# Patient Record
Sex: Male | Born: 1960 | Race: White | Hispanic: No | Marital: Single | Smoking: Never smoker
Health system: Southern US, Community
[De-identification: ages and names within clinical notes are randomized; demographics above are authoritative.]

## PROBLEM LIST (undated history)

## (undated) DIAGNOSIS — R51 Headache: Secondary | ICD-10-CM

## (undated) DIAGNOSIS — J45909 Unspecified asthma, uncomplicated: Secondary | ICD-10-CM

## (undated) DIAGNOSIS — D75839 Thrombocytosis, unspecified: Secondary | ICD-10-CM

## (undated) DIAGNOSIS — N471 Phimosis: Secondary | ICD-10-CM

## (undated) DIAGNOSIS — K900 Celiac disease: Secondary | ICD-10-CM

## (undated) DIAGNOSIS — R748 Abnormal levels of other serum enzymes: Secondary | ICD-10-CM

## (undated) DIAGNOSIS — D473 Essential (hemorrhagic) thrombocythemia: Secondary | ICD-10-CM

## (undated) DIAGNOSIS — R945 Abnormal results of liver function studies: Secondary | ICD-10-CM

## (undated) DIAGNOSIS — K2970 Gastritis, unspecified, without bleeding: Secondary | ICD-10-CM

## (undated) DIAGNOSIS — J449 Chronic obstructive pulmonary disease, unspecified: Secondary | ICD-10-CM

## (undated) DIAGNOSIS — H409 Unspecified glaucoma: Secondary | ICD-10-CM

## (undated) DIAGNOSIS — N400 Enlarged prostate without lower urinary tract symptoms: Secondary | ICD-10-CM

## (undated) DIAGNOSIS — K5790 Diverticulosis of intestine, part unspecified, without perforation or abscess without bleeding: Secondary | ICD-10-CM

## (undated) DIAGNOSIS — D519 Vitamin B12 deficiency anemia, unspecified: Secondary | ICD-10-CM

## (undated) DIAGNOSIS — B9681 Helicobacter pylori [H. pylori] as the cause of diseases classified elsewhere: Secondary | ICD-10-CM

## (undated) DIAGNOSIS — K219 Gastro-esophageal reflux disease without esophagitis: Secondary | ICD-10-CM

## (undated) DIAGNOSIS — R519 Headache, unspecified: Secondary | ICD-10-CM

## (undated) DIAGNOSIS — I10 Essential (primary) hypertension: Secondary | ICD-10-CM

## (undated) DIAGNOSIS — C6990 Malignant neoplasm of unspecified site of unspecified eye: Secondary | ICD-10-CM

## (undated) HISTORY — DX: Thrombocytosis, unspecified: D75.839

## (undated) HISTORY — DX: Headache, unspecified: R51.9

## (undated) HISTORY — DX: Benign prostatic hyperplasia without lower urinary tract symptoms: N40.0

## (undated) HISTORY — DX: Abnormal results of liver function studies: R94.5

## (undated) HISTORY — DX: Unspecified asthma, uncomplicated: J45.909

## (undated) HISTORY — PX: EYE SURGERY: SHX253

## (undated) HISTORY — DX: Helicobacter pylori (H. pylori) as the cause of diseases classified elsewhere: B96.81

## (undated) HISTORY — DX: Phimosis: N47.1

## (undated) HISTORY — DX: Celiac disease: K90.0

## (undated) HISTORY — DX: Unspecified glaucoma: H40.9

## (undated) HISTORY — DX: Helicobacter pylori (H. pylori) as the cause of diseases classified elsewhere: K29.70

## (undated) HISTORY — DX: Gastro-esophageal reflux disease without esophagitis: K21.9

## (undated) HISTORY — DX: Headache: R51

## (undated) HISTORY — DX: Essential (primary) hypertension: I10

## (undated) HISTORY — DX: Malignant neoplasm of unspecified site of unspecified eye: C69.90

## (undated) HISTORY — DX: Chronic obstructive pulmonary disease, unspecified: J44.9

## (undated) HISTORY — DX: Diverticulosis of intestine, part unspecified, without perforation or abscess without bleeding: K57.90

## (undated) HISTORY — DX: Essential (hemorrhagic) thrombocythemia: D47.3

## (undated) HISTORY — DX: Vitamin B12 deficiency anemia, unspecified: D51.9

## (undated) HISTORY — DX: Abnormal levels of other serum enzymes: R74.8

---

## 2011-06-12 ENCOUNTER — Inpatient Hospital Stay: Payer: Self-pay | Admitting: Internal Medicine

## 2011-09-30 ENCOUNTER — Ambulatory Visit: Payer: Self-pay | Admitting: "Endocrinology

## 2011-10-18 ENCOUNTER — Ambulatory Visit: Payer: Self-pay | Admitting: "Endocrinology

## 2011-12-15 ENCOUNTER — Ambulatory Visit: Payer: Self-pay

## 2011-12-25 ENCOUNTER — Ambulatory Visit: Payer: Self-pay | Admitting: Adult Health

## 2014-07-29 ENCOUNTER — Emergency Department: Payer: Self-pay | Admitting: Emergency Medicine

## 2014-07-29 LAB — COMPREHENSIVE METABOLIC PANEL
ALBUMIN: 3.7 g/dL (ref 3.4–5.0)
ALK PHOS: 133 U/L — AB
ALT: 43 U/L
ANION GAP: 8 (ref 7–16)
BILIRUBIN TOTAL: 0.2 mg/dL (ref 0.2–1.0)
BUN: 9 mg/dL (ref 7–18)
CALCIUM: 9.1 mg/dL (ref 8.5–10.1)
CO2: 26 mmol/L (ref 21–32)
CREATININE: 0.72 mg/dL (ref 0.60–1.30)
Chloride: 108 mmol/L — ABNORMAL HIGH (ref 98–107)
EGFR (Non-African Amer.): >60
Glucose: 82 mg/dL (ref 65–99)
OSMOLALITY: 281 (ref 275–301)
Potassium: 3.5 mmol/L (ref 3.5–5.1)
SGOT(AST): 47 U/L — ABNORMAL HIGH (ref 15–37)
Sodium: 142 mmol/L (ref 136–145)
TOTAL PROTEIN: 7.5 g/dL (ref 6.4–8.2)

## 2014-07-29 LAB — URINALYSIS, COMPLETE
BILIRUBIN, UR: NEGATIVE
Bacteria: NONE SEEN
Blood: NEGATIVE
GLUCOSE, UR: NEGATIVE mg/dL (ref 0–75)
Ketone: NEGATIVE
Leukocyte Esterase: NEGATIVE
Nitrite: NEGATIVE
Ph: 5 (ref 4.5–8.0)
Protein: NEGATIVE
RBC,UR: 1 /HPF (ref 0–5)
Specific Gravity: 1.015 (ref 1.003–1.030)
Squamous Epithelial: NONE SEEN

## 2014-07-29 LAB — CBC WITH DIFFERENTIAL/PLATELET
BASOS ABS: 0.1 10*3/uL (ref 0.0–0.1)
Basophil %: 0.9 %
EOS PCT: 4.6 %
Eosinophil #: 0.3 10*3/uL (ref 0.0–0.7)
HCT: 50 % (ref 40.0–52.0)
HGB: 16.1 g/dL (ref 13.0–18.0)
Lymphocyte #: 1.2 10*3/uL (ref 1.0–3.6)
Lymphocyte %: 18.5 %
MCH: 30.7 pg (ref 26.0–34.0)
MCHC: 32.1 g/dL (ref 32.0–36.0)
MCV: 95 fL (ref 80–100)
MONOS PCT: 12.2 %
Monocyte #: 0.8 x10 3/mm (ref 0.2–1.0)
Neutrophil #: 4 10*3/uL (ref 1.4–6.5)
Neutrophil %: 63.8 %
PLATELETS: 522 10*3/uL — AB (ref 150–440)
RBC: 5.24 10*6/uL (ref 4.40–5.90)
RDW: 14.7 % — ABNORMAL HIGH (ref 11.5–14.5)
WBC: 6.3 10*3/uL (ref 3.8–10.6)

## 2014-07-29 LAB — LIPASE, BLOOD: Lipase: 79 U/L (ref 73–393)

## 2014-08-09 ENCOUNTER — Ambulatory Visit: Payer: Self-pay | Admitting: Nurse Practitioner

## 2014-08-21 ENCOUNTER — Emergency Department: Payer: Self-pay | Admitting: Emergency Medicine

## 2014-08-21 LAB — COMPREHENSIVE METABOLIC PANEL
ALT: 45 U/L
ANION GAP: 6 — AB (ref 7–16)
AST: 31 U/L (ref 15–37)
Albumin: 3.7 g/dL (ref 3.4–5.0)
Alkaline Phosphatase: 204 U/L — ABNORMAL HIGH
BILIRUBIN TOTAL: 0.2 mg/dL (ref 0.2–1.0)
BUN: 8 mg/dL (ref 7–18)
CHLORIDE: 105 mmol/L (ref 98–107)
CREATININE: 0.82 mg/dL (ref 0.60–1.30)
Calcium, Total: 8.6 mg/dL (ref 8.5–10.1)
Co2: 28 mmol/L (ref 21–32)
EGFR (Non-African Amer.): >60
Glucose: 72 mg/dL (ref 65–99)
OSMOLALITY: 274 (ref 275–301)
Potassium: 3.6 mmol/L (ref 3.5–5.1)
Sodium: 139 mmol/L (ref 136–145)
Total Protein: 7.5 g/dL (ref 6.4–8.2)

## 2014-08-21 LAB — CBC WITH DIFFERENTIAL/PLATELET
BASOS PCT: 1.3 %
Basophil #: 0.1 10*3/uL (ref 0.0–0.1)
EOS PCT: 5.8 %
Eosinophil #: 0.6 10*3/uL (ref 0.0–0.7)
HCT: 47.6 % (ref 40.0–52.0)
HGB: 15.7 g/dL (ref 13.0–18.0)
Lymphocyte #: 1.7 10*3/uL (ref 1.0–3.6)
Lymphocyte %: 16.1 %
MCH: 30.4 pg (ref 26.0–34.0)
MCHC: 32.9 g/dL (ref 32.0–36.0)
MCV: 93 fL (ref 80–100)
Monocyte #: 1.5 x10 3/mm — ABNORMAL HIGH (ref 0.2–1.0)
Monocyte %: 13.7 %
NEUTROS ABS: 6.7 10*3/uL — AB (ref 1.4–6.5)
Neutrophil %: 63.1 %
Platelet: 473 10*3/uL — ABNORMAL HIGH (ref 150–440)
RBC: 5.14 10*6/uL (ref 4.40–5.90)
RDW: 14.6 % — AB (ref 11.5–14.5)
WBC: 10.7 10*3/uL — ABNORMAL HIGH (ref 3.8–10.6)

## 2014-08-21 LAB — D-DIMER(ARMC): D-DIMER: 209 ng/mL

## 2014-08-21 LAB — PRO B NATRIURETIC PEPTIDE: B-Type Natriuretic Peptide: 114 pg/mL (ref 0–125)

## 2014-10-19 ENCOUNTER — Ambulatory Visit: Payer: Self-pay | Admitting: Family Medicine

## 2014-10-23 DIAGNOSIS — R748 Abnormal levels of other serum enzymes: Secondary | ICD-10-CM

## 2014-10-23 DIAGNOSIS — R945 Abnormal results of liver function studies: Secondary | ICD-10-CM

## 2014-10-23 HISTORY — DX: Abnormal results of liver function studies: R94.5

## 2014-10-23 HISTORY — DX: Abnormal levels of other serum enzymes: R74.8

## 2014-11-10 IMAGING — US ABDOMEN ULTRASOUND
1 series · 14 of 25 positions shown · non-contrast
Comparison: None.

CLINICAL DATA: Right-sided abdominal pain with diarrhea and weight
loss.

EXAM:
ULTRASOUND ABDOMEN COMPLETE

[Series 1: abdomen ultrasound · 0.20mm/px · 14 of 96 slices shown]
[im 1/96]
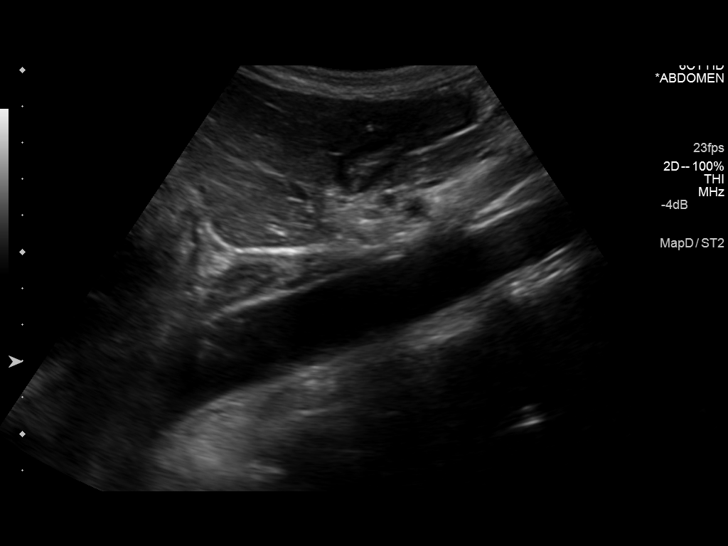
[im 8/96]
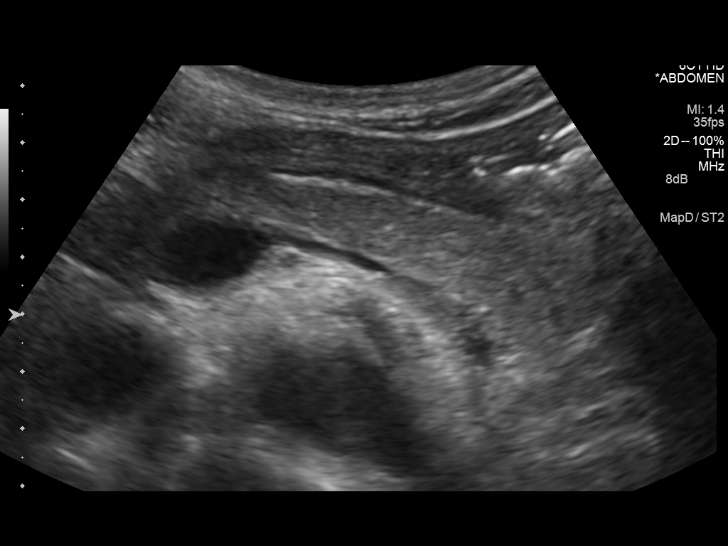
[im 16/96]
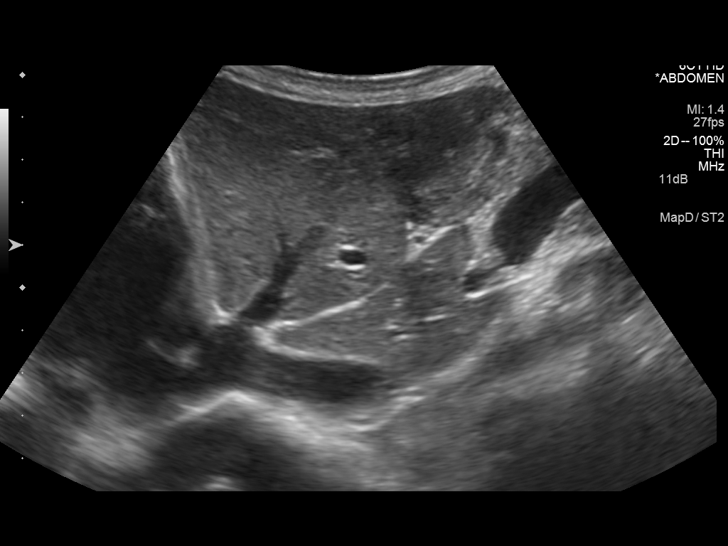
[im 24/96]
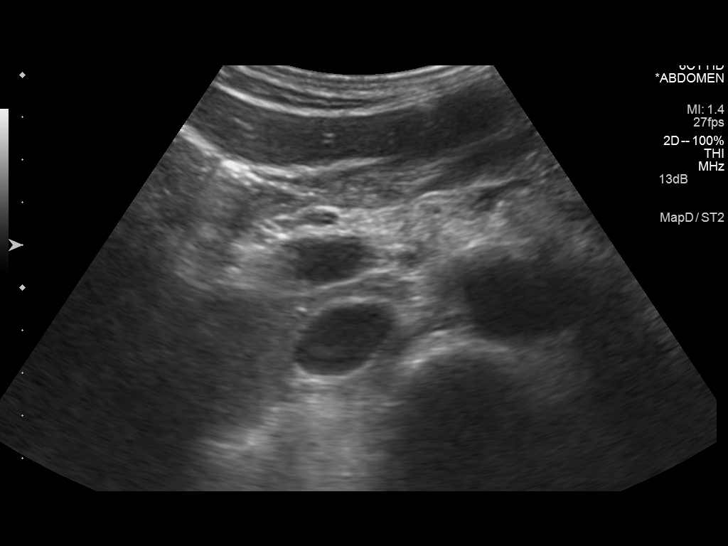
[im 32/96]
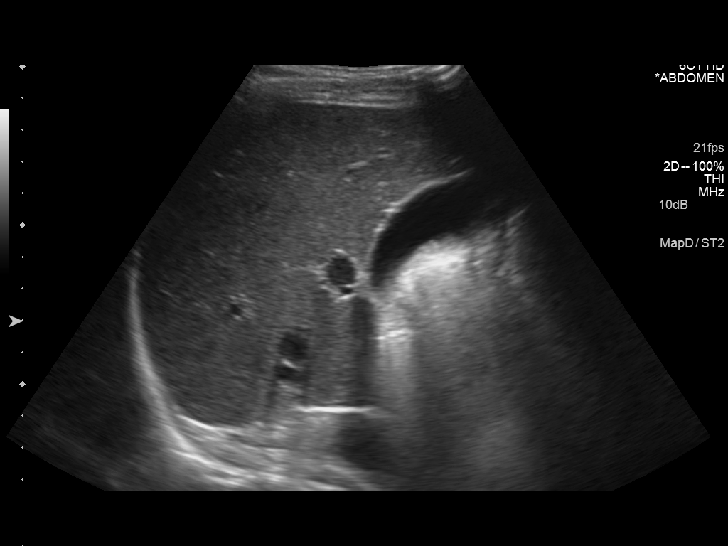
[im 36/96]
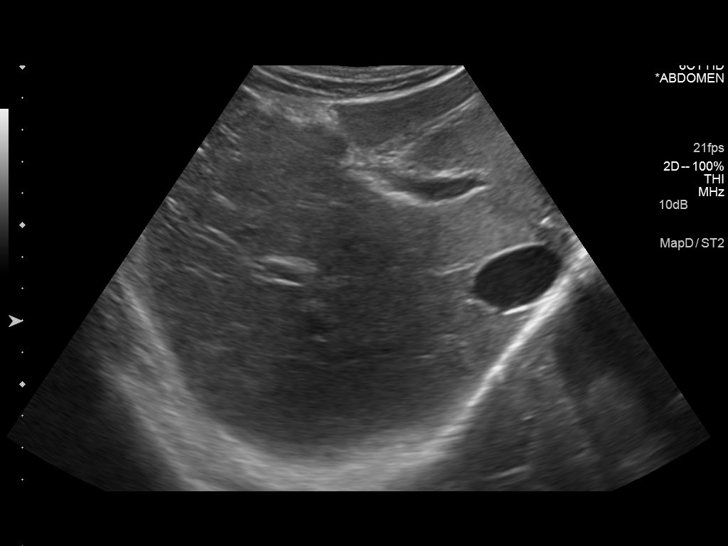
[im 44/96]
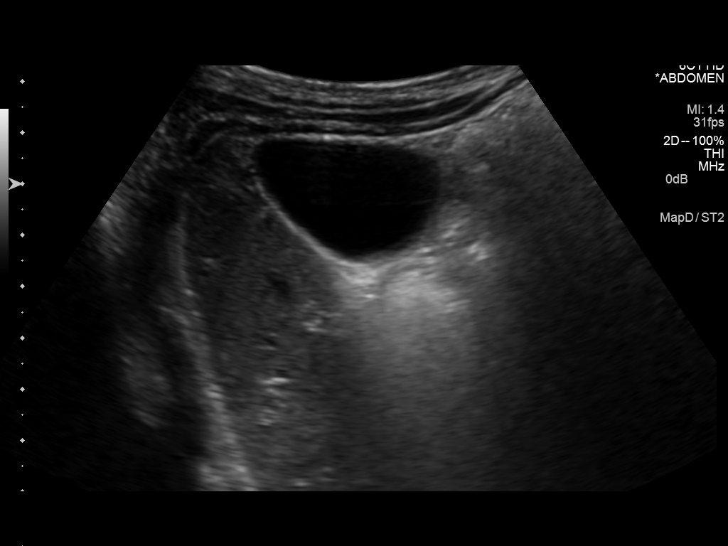
[im 52/96]
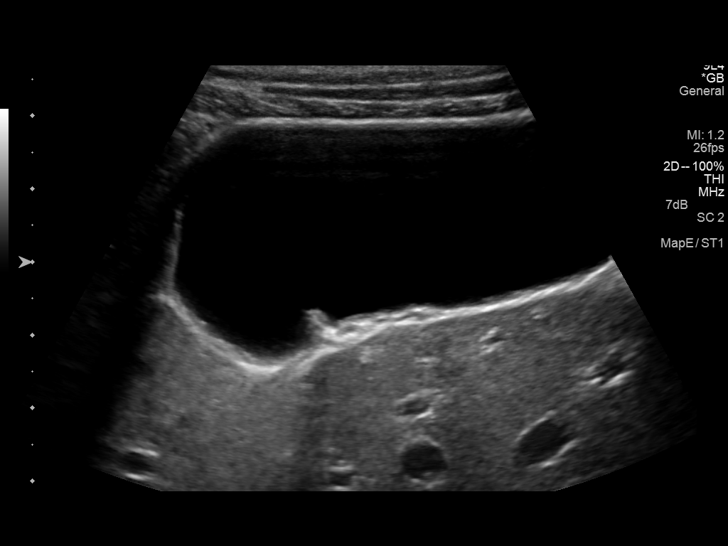
[im 60/96]
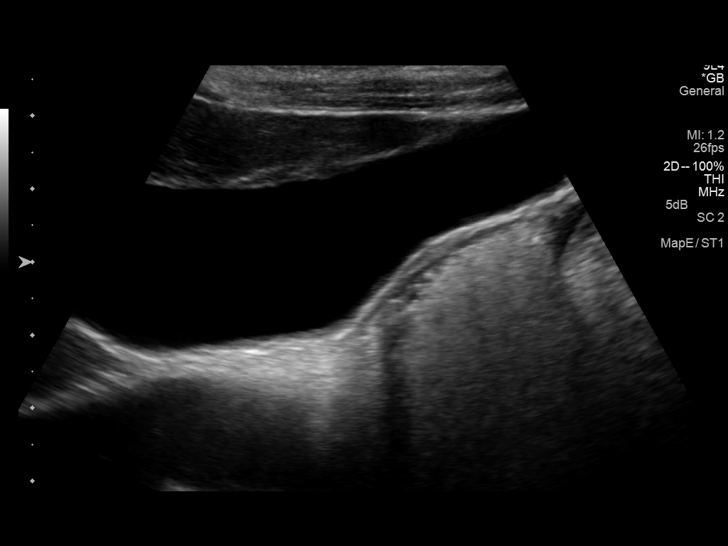
[im 64/96]
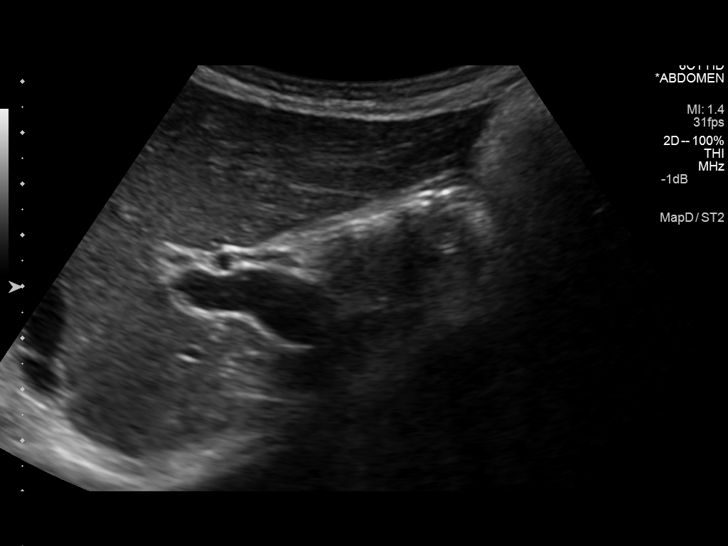
[im 72/96]
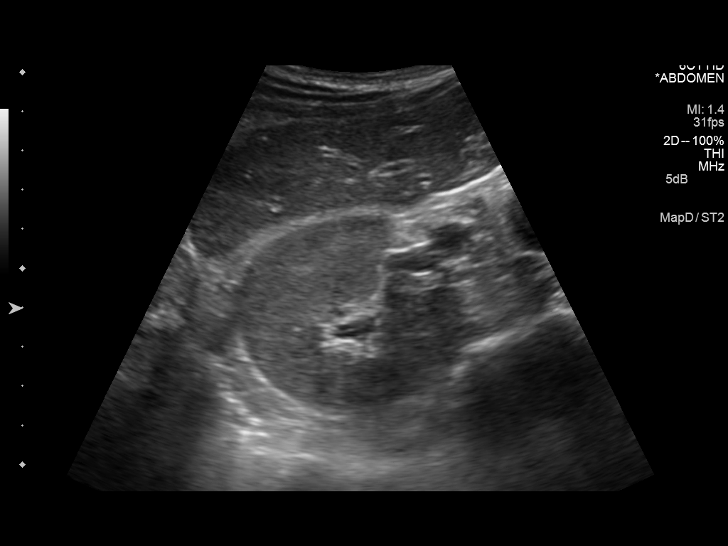
[im 80/96]
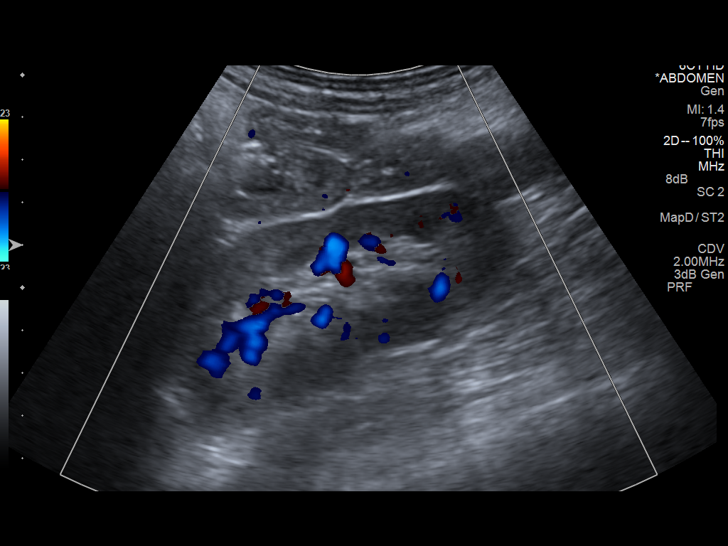
[im 88/96]
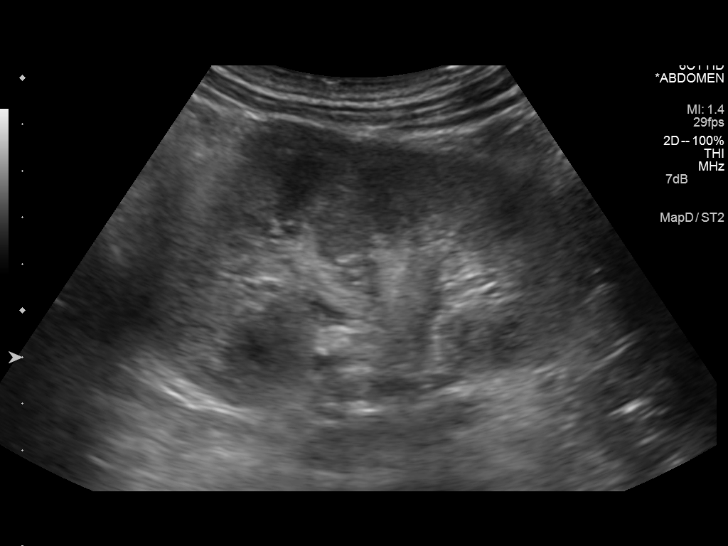
[im 96/96]
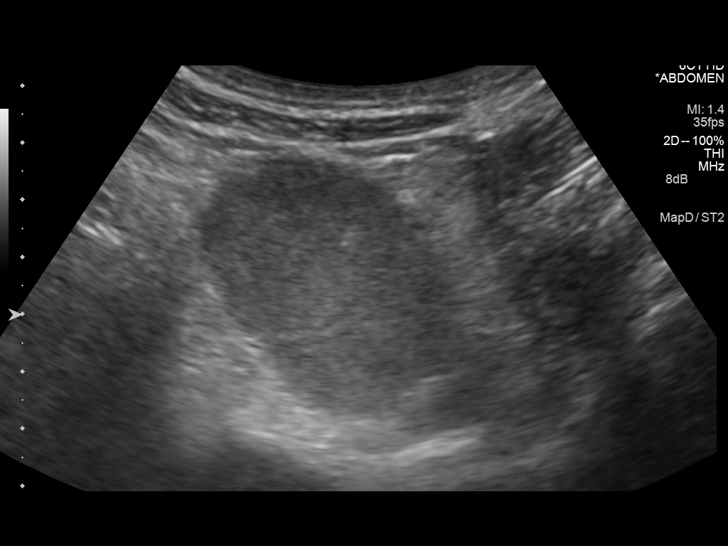

[14 of 25 positions shown; findings below may reference images not displayed]

FINDINGS: Gallbladder:

No gallstones or wall thickening visualized. No sonographic Murphy
sign noted.

Common bile duct:

Diameter: Normal measuring 1.9 mm.

Liver:

No focal lesion identified. Within normal limits in parenchymal
echogenicity.

IVC:

No abnormality visualized.

Pancreas:

Visualized portion unremarkable.

Spleen:

Size and appearance within normal limits.

Right Kidney:

Length: 11.2 cm. Echogenicity within normal limits. No mass or
hydronephrosis visualized.

Left Kidney:

Length: 10.9 cm. Echogenicity within normal limits. No mass or
hydronephrosis visualized.

Abdominal aorta:

No aneurysm visualized.

Other findings:

None.
IMPRESSION: Negative abdominal ultrasound.

## 2015-01-02 HISTORY — PX: COLONOSCOPY: SHX174

## 2015-02-26 DIAGNOSIS — N400 Enlarged prostate without lower urinary tract symptoms: Secondary | ICD-10-CM | POA: Insufficient documentation

## 2015-02-26 DIAGNOSIS — C6990 Malignant neoplasm of unspecified site of unspecified eye: Secondary | ICD-10-CM | POA: Insufficient documentation

## 2015-02-26 DIAGNOSIS — K900 Celiac disease: Secondary | ICD-10-CM | POA: Insufficient documentation

## 2015-02-26 DIAGNOSIS — K219 Gastro-esophageal reflux disease without esophagitis: Secondary | ICD-10-CM | POA: Insufficient documentation

## 2015-02-26 DIAGNOSIS — K5790 Diverticulosis of intestine, part unspecified, without perforation or abscess without bleeding: Secondary | ICD-10-CM | POA: Insufficient documentation

## 2015-02-26 DIAGNOSIS — D519 Vitamin B12 deficiency anemia, unspecified: Secondary | ICD-10-CM | POA: Insufficient documentation

## 2015-02-26 DIAGNOSIS — H409 Unspecified glaucoma: Secondary | ICD-10-CM | POA: Insufficient documentation

## 2015-02-26 DIAGNOSIS — N471 Phimosis: Secondary | ICD-10-CM | POA: Insufficient documentation

## 2015-04-24 ENCOUNTER — Other Ambulatory Visit: Payer: Self-pay

## 2015-04-24 LAB — CBC AND DIFFERENTIAL
HCT: 47 % (ref 41–53)
Hemoglobin: 16.3 g/dL (ref 13.5–17.5)
Neutrophils Absolute: 5 /uL
PLATELETS: 422 10*3/uL — AB (ref 150–399)
WBC: 7.1 10*3/mL

## 2015-04-24 LAB — TSH: TSH: 1.45 u[IU]/mL (ref 0.41–5.90)

## 2015-04-24 LAB — BASIC METABOLIC PANEL
BUN: 6 mg/dL (ref 4–21)
Creatinine: 0.8 mg/dL (ref 0.6–1.3)
Glucose: 136 mg/dL
Potassium: 3.9 mmol/L (ref 3.4–5.3)
Sodium: 142 mmol/L (ref 137–147)

## 2015-04-24 LAB — HEPATIC FUNCTION PANEL
ALT: 15 U/L (ref 10–40)
AST: 22 U/L (ref 14–40)
Alkaline Phosphatase: 105 U/L (ref 25–125)
Bilirubin, Total: 0.1 mg/dL

## 2015-04-24 LAB — HEMOGLOBIN A1C: Hemoglobin A1C: 5.9

## 2015-05-01 ENCOUNTER — Ambulatory Visit: Payer: Self-pay

## 2015-05-01 DIAGNOSIS — R7303 Prediabetes: Secondary | ICD-10-CM | POA: Insufficient documentation

## 2015-05-31 IMAGING — CR DG CHEST 2V
1 series · 2 of 2 positions shown · non-contrast
Comparison: 08/21/2014

CLINICAL DATA: persistent cough x 2-3 months, non productive, SHOB,
COPD, mid chest tightness and posterior chest pain

EXAM:
CHEST  2 VIEW

[Series 1: pa · 0.17mm/px · 2 of 2 slices shown]
[im 1/2]
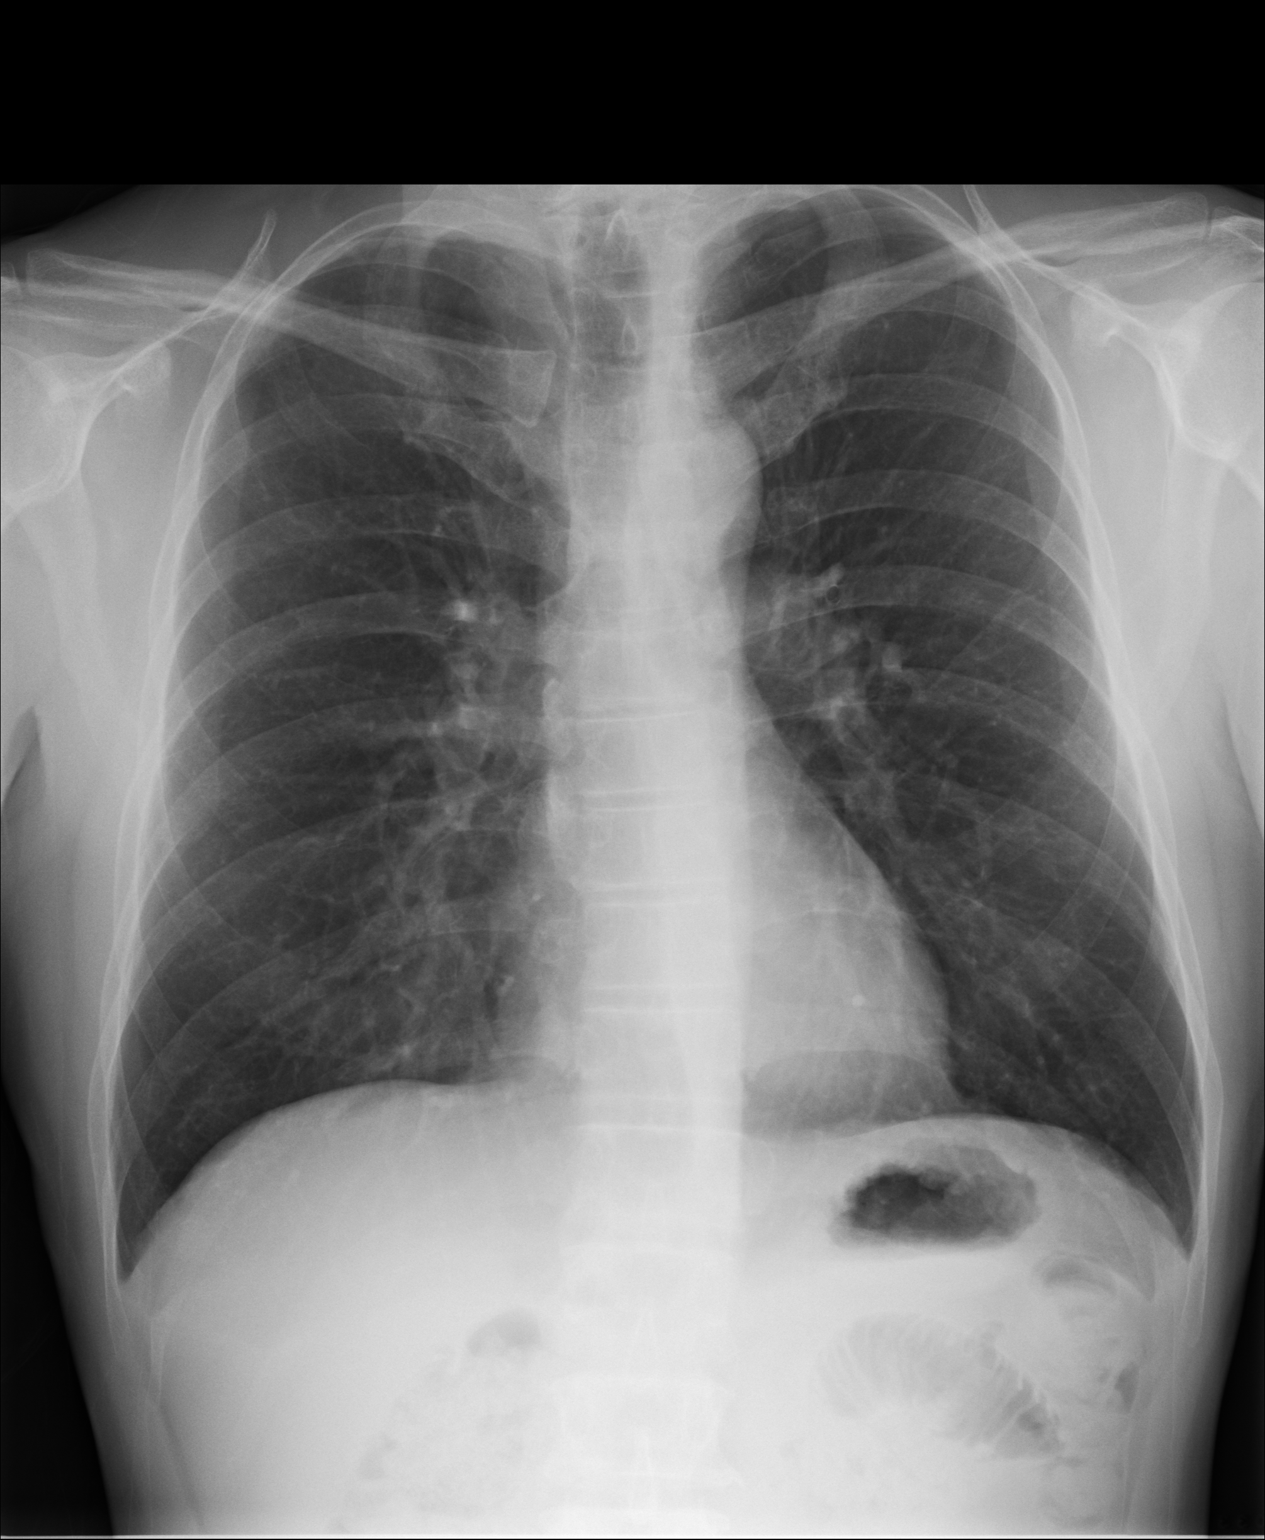
[im 2/2]
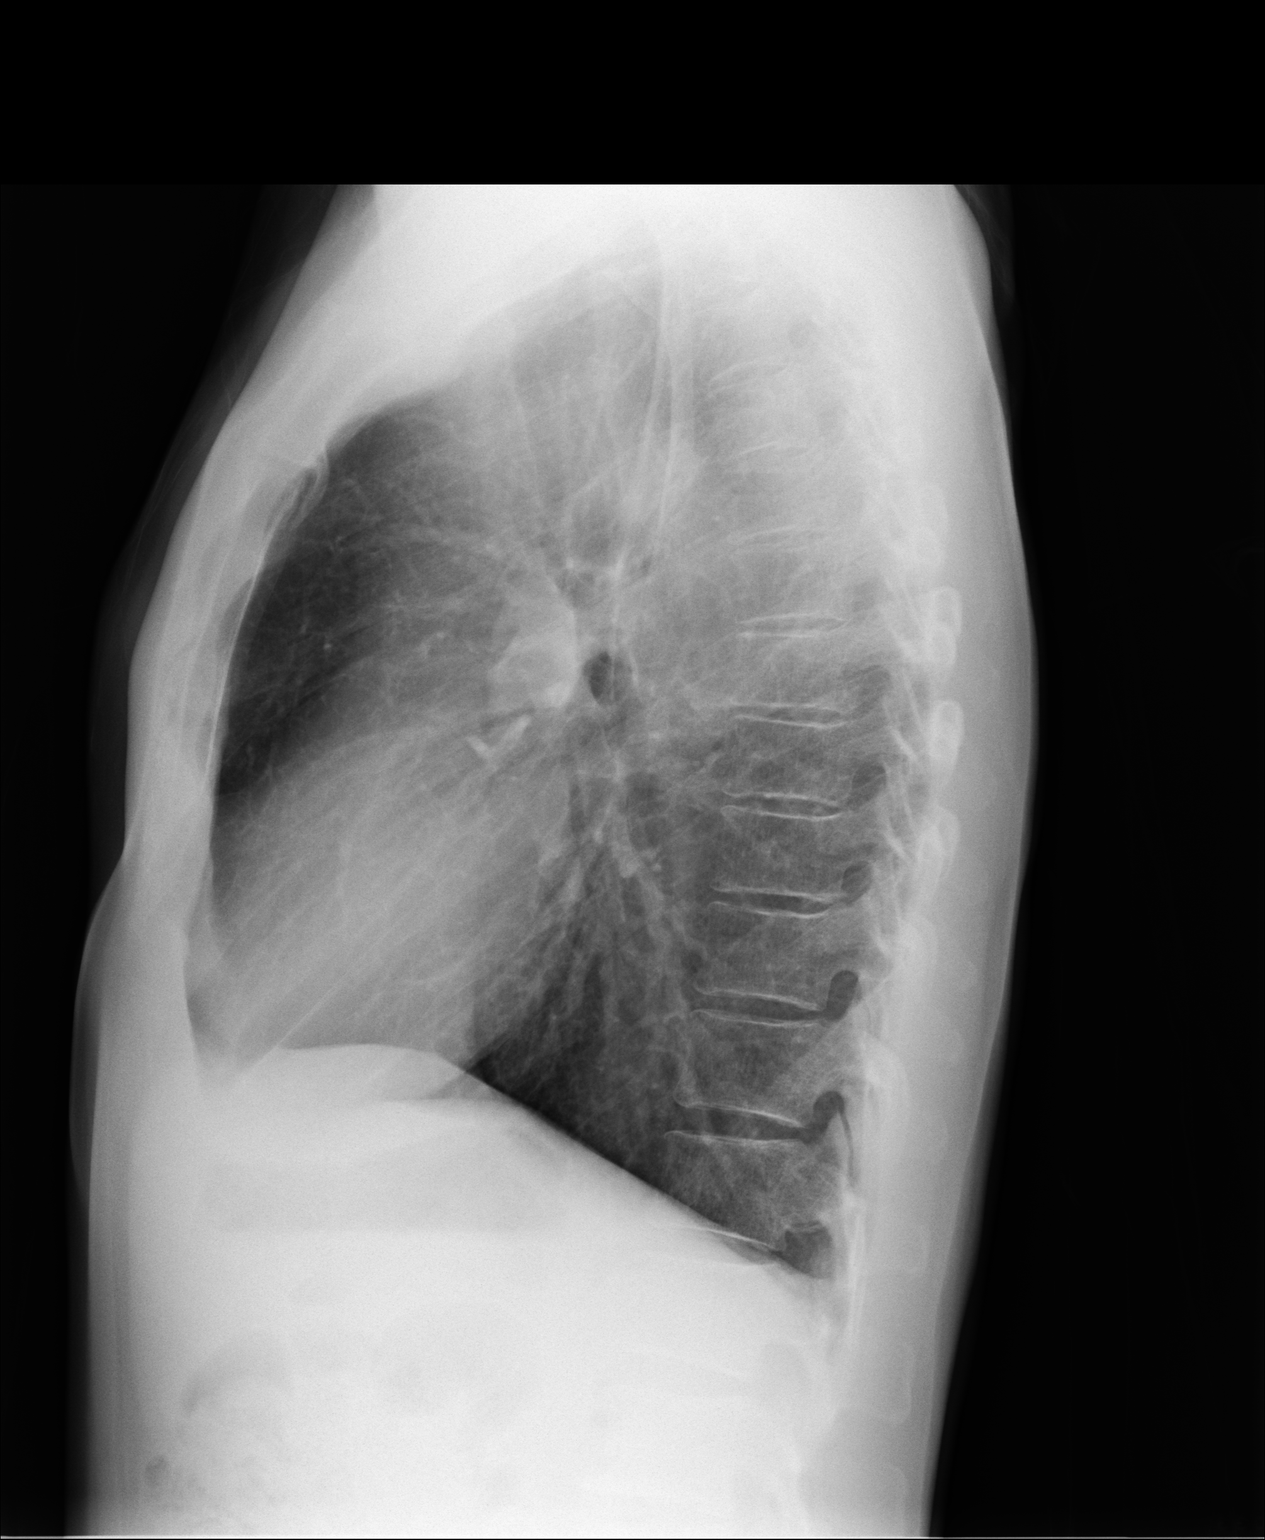

[2 of 2 positions shown; findings below may reference images not displayed]

FINDINGS: Cardiac silhouette is normal in size and configuration. Normal
mediastinal and hilar contours.

Lungs are mildly hyperexpanded but clear. No pleural effusion. No
pneumothorax.

Bony thorax is intact.
IMPRESSION: No acute cardiopulmonary disease.

## 2015-07-12 ENCOUNTER — Ambulatory Visit: Payer: Self-pay

## 2015-08-09 ENCOUNTER — Other Ambulatory Visit: Payer: Self-pay

## 2015-09-06 ENCOUNTER — Other Ambulatory Visit: Payer: Self-pay

## 2015-10-09 ENCOUNTER — Other Ambulatory Visit: Payer: Self-pay

## 2015-11-08 ENCOUNTER — Other Ambulatory Visit: Payer: Self-pay

## 2015-12-13 ENCOUNTER — Other Ambulatory Visit: Payer: Self-pay

## 2016-01-08 ENCOUNTER — Ambulatory Visit: Payer: Self-pay

## 2016-01-08 ENCOUNTER — Other Ambulatory Visit: Payer: Self-pay

## 2016-01-14 DIAGNOSIS — R7303 Prediabetes: Secondary | ICD-10-CM

## 2016-01-15 ENCOUNTER — Other Ambulatory Visit: Payer: Self-pay

## 2016-01-22 ENCOUNTER — Telehealth: Payer: Self-pay

## 2016-01-22 NOTE — Telephone Encounter (Signed)
Left message to schedule appointment @ [phone]

## 2016-01-22 NOTE — Telephone Encounter (Signed)
Patient called to reschedule missed appointment b12 injection. Patient stated he also wanted to see a doctor.  [phone]

## 2016-01-23 NOTE — Telephone Encounter (Signed)
Patient came in to schedule an appointment.

## 2016-01-24 ENCOUNTER — Ambulatory Visit: Payer: Self-pay | Admitting: Family Medicine

## 2016-02-05 ENCOUNTER — Telehealth: Payer: Self-pay

## 2016-02-05 NOTE — Telephone Encounter (Signed)
Pt lm to verify his appointment date mr asked for call back and stated if he did not answer, it is ok to leave information on voice mail. Lm @ [phone] letting mr know his appointment is this Thursday February 07, 2016 @ 7:15 for MD and B12 n injection.

## 2016-02-07 ENCOUNTER — Ambulatory Visit: Payer: Self-pay | Admitting: Urology

## 2016-02-07 VITALS — BP 169/90 | HR 72 | Wt 128.0 lb

## 2016-02-07 DIAGNOSIS — I10 Essential (primary) hypertension: Secondary | ICD-10-CM | POA: Insufficient documentation

## 2016-02-07 DIAGNOSIS — Z131 Encounter for screening for diabetes mellitus: Secondary | ICD-10-CM

## 2016-02-07 DIAGNOSIS — D518 Other vitamin B12 deficiency anemias: Secondary | ICD-10-CM | POA: Insufficient documentation

## 2016-02-07 LAB — GLUCOSE, POCT (MANUAL RESULT ENTRY): POC Glucose: 58 mg/dl — AB (ref 70–99)

## 2016-02-07 MED ORDER — CYANOCOBALAMIN 1000 MCG/ML IJ SOLN
1000.0000 ug | Freq: Once | INTRAMUSCULAR | Status: AC
  Administered 2016-09-04: 1000 ug via INTRAMUSCULAR

## 2016-02-07 MED ORDER — TAMSULOSIN HCL 0.4 MG PO CAPS: 0.4000 mg | ORAL_CAPSULE | Freq: Two times a day (BID) | ORAL | Status: DC

## 2016-02-07 MED ORDER — HYDROCHLOROTHIAZIDE 25 MG PO TABS: 25.0000 mg | ORAL_TABLET | Freq: Every day | ORAL | Status: DC

## 2016-02-07 MED ORDER — DICYCLOMINE HCL 10 MG PO CAPS: 10.0000 mg | ORAL_CAPSULE | Freq: Four times a day (QID) | ORAL | Status: DC | PRN

## 2016-02-07 MED ORDER — TAMSULOSIN HCL 0.4 MG PO CAPS: 0.4000 mg | ORAL_CAPSULE | Freq: Two times a day (BID) | ORAL | Status: AC

## 2016-02-07 MED ORDER — OMEPRAZOLE 20 MG PO CPDR: 20.0000 mg | DELAYED_RELEASE_CAPSULE | Freq: Two times a day (BID) | ORAL | Status: DC

## 2016-02-07 MED ORDER — RANITIDINE HCL 150 MG PO CAPS: 150.0000 mg | ORAL_CAPSULE | Freq: Two times a day (BID) | ORAL | Status: DC

## 2016-02-07 NOTE — Progress Notes (Signed)
       Patient: Terry Tapia Male    DOB: 24-Dec-1960   55 y.o.   MRN: [id number] Visit Date: 02/07/2016  Today's Provider: Flora   Chief Complaint  Patient presents with  . Hypertension   Subjective:    HPI Patient with phimosis.  Circumcision pending at [facility].  History of BPH with LUTS on Flomax.  HTN, untreated at this time.  He is over due for his b12 shot.  Meds need to refilled.  Wanted a Hep B vaccination.      Allergies  Allergen Reactions  . Penicillins Anaphylaxis   Previous Medications   ALBUTEROL (PROVENTIL HFA;VENTOLIN HFA) 108 (90 BASE) MCG/ACT INHALER    Inhale into the lungs every 6 (six) hours as needed for wheezing or shortness of breath.   CYANOCOBALAMIN (B-12 COMPLIANCE INJECTION IJ)    Inject 1,000 Units as directed every 28 (twenty-eight) days. 1 injection every month.   DICYCLOMINE (BENTYL) 10 MG CAPSULE    Take 10 mg by mouth 4 (four) times daily as needed for spasms (4 times daily for abdominal pain.).   LORATADINE (CLARITIN) 10 MG TABLET    Take 10 mg by mouth daily.   MONTELUKAST (SINGULAIR) 10 MG TABLET    Take 10 mg by mouth at bedtime.   OMEPRAZOLE (PRILOSEC) 20 MG CAPSULE    Take 20 mg by mouth 2 (two) times daily before a meal.   RANITIDINE (ZANTAC) 150 MG CAPSULE    Take 150 mg by mouth 2 (two) times daily.   TAMSULOSIN (FLOMAX) 0.4 MG CAPS CAPSULE    Take 0.4 mg by mouth 2 (two) times daily.    TRIAMCINOLONE (NASACORT) 55 MCG/ACT AERO NASAL INHALER    Place 2 sprays into the nose daily. Reported on 02/07/2016    Review of Systems  Social History  Substance Use Topics  . Smoking status: Never Smoker   . Smokeless tobacco: Never Used  . Alcohol Use: No   Objective:   BP 169/90 mmHg  Pulse 72  Wt 128 lb (58.06 kg)  Physical Exam  Constitutional: He appears well-developed and well-nourished.  HENT:  Head: Normocephalic.  Cardiovascular: Normal rate, regular rhythm and normal heart sounds.   Pulmonary/Chest: Effort  normal and breath sounds normal.  Abdominal: Soft. Bowel sounds are normal.        Assessment & Plan:     1. HTN:  Started on HCTZ 25 mg daily tonight.  He will need to RTC in 2 weeks for BP check.  Needs ECG.  He wants to see Dr. Ancil Boozer.  2. Vitamin b12 deficiency anemia:   Injection given today.  RTC in one month for next B12 shot. Will need CBC, TSH, CMP and HBGA1C.    3. GERD:  Meds refilled.        [facility]

## 2016-02-21 ENCOUNTER — Ambulatory Visit: Payer: Self-pay

## 2016-02-21 ENCOUNTER — Other Ambulatory Visit: Payer: Self-pay | Admitting: Urology

## 2016-02-21 VITALS — BP 145/93

## 2016-02-21 DIAGNOSIS — I10 Essential (primary) hypertension: Secondary | ICD-10-CM

## 2016-02-21 MED ORDER — TRIAMCINOLONE ACETONIDE 55 MCG/ACT NA AERO: 2.0000 | INHALATION_SPRAY | Freq: Every day | NASAL | Status: DC

## 2016-02-21 MED ORDER — MONTELUKAST SODIUM 10 MG PO TABS: 10.0000 mg | ORAL_TABLET | Freq: Every day | ORAL | Status: AC

## 2016-02-21 MED ORDER — LORATADINE 10 MG PO TABS: 10.0000 mg | ORAL_TABLET | Freq: Every day | ORAL | Status: AC

## 2016-02-21 MED ORDER — DICYCLOMINE HCL 10 MG PO CAPS: 10.0000 mg | ORAL_CAPSULE | Freq: Four times a day (QID) | ORAL | Status: DC | PRN

## 2016-02-21 MED ORDER — ALBUTEROL SULFATE HFA 108 (90 BASE) MCG/ACT IN AERS: 2.0000 | INHALATION_SPRAY | Freq: Four times a day (QID) | RESPIRATORY_TRACT | Status: AC | PRN

## 2016-02-22 NOTE — Progress Notes (Signed)
Shannon reviewed BP and EKG. Per Larene Beach, BP within normal limits and no change in medications.

## 2016-02-26 ENCOUNTER — Other Ambulatory Visit: Payer: Self-pay | Admitting: Nurse Practitioner

## 2016-02-27 ENCOUNTER — Telehealth: Payer: Self-pay

## 2016-02-27 ENCOUNTER — Other Ambulatory Visit: Payer: Self-pay

## 2016-02-27 NOTE — Telephone Encounter (Signed)
Received fax from medication management Nasacort out of stock no longer on PAP Consider veramyst or Flonase

## 2016-02-27 NOTE — Telephone Encounter (Signed)
Received fax from Medication management Nasacort AQ out of stock  Consider Veramyst or Flonase

## 2016-03-06 MED ORDER — FLUTICASONE PROPIONATE 50 MCG/ACT NA SUSP: 2.0000 | Freq: Every day | NASAL | Status: DC

## 2016-03-11 ENCOUNTER — Other Ambulatory Visit: Payer: Self-pay | Admitting: Urology

## 2016-03-13 ENCOUNTER — Other Ambulatory Visit: Payer: Self-pay

## 2016-03-18 ENCOUNTER — Ambulatory Visit: Payer: Self-pay | Admitting: Nurse Practitioner

## 2016-03-18 VITALS — BP 137/81 | HR 79 | Ht 65.0 in | Wt 127.0 lb

## 2016-03-18 DIAGNOSIS — IMO0001 Reserved for inherently not codable concepts without codable children: Secondary | ICD-10-CM

## 2016-03-18 DIAGNOSIS — R030 Elevated blood-pressure reading, without diagnosis of hypertension: Secondary | ICD-10-CM

## 2016-03-18 DIAGNOSIS — E538 Deficiency of other specified B group vitamins: Secondary | ICD-10-CM

## 2016-03-18 DIAGNOSIS — R5383 Other fatigue: Secondary | ICD-10-CM

## 2016-03-18 DIAGNOSIS — E782 Mixed hyperlipidemia: Secondary | ICD-10-CM

## 2016-03-18 DIAGNOSIS — R739 Hyperglycemia, unspecified: Secondary | ICD-10-CM

## 2016-03-18 NOTE — Progress Notes (Signed)
   Subjective:    Patient ID: Terry Tapia, male    DOB: Dec 01, 1960, 55 y.o.   MRN: [id number]  HPI   Works part-time at [employer]   Is overdue for labs, none for approx 10 months, needs B12 injection  Has had fluctuating blood pressure, was started on a med that dropped systolic to [id number], made him dizzy and he stopped taking the med,. States he had a bp reaidng of 189/90.  Reports he has not, as best he knows, has not had a repeated episode of elevated bp, believes he is to have another stress test.     Breathing is doing well GI doing better as he is on a gluten free diet.  Appetite is much better.        Review of Systems  Constitutional: Negative.   HENT: Positive for congestion.   Eyes: Negative.   Respiratory: Negative.   Cardiovascular: Negative.   Gastrointestinal: Negative.   Genitourinary: Positive for frequency. Negative for difficulty urinating.  Neurological: Negative for dizziness and syncope.  Hematological: Negative for adenopathy.       Objective:   Physical Exam  Constitutional: He is oriented to person, place, and time.  Cardiovascular: Normal rate and regular rhythm.   Pulmonary/Chest: Effort normal and breath sounds normal.  Musculoskeletal: He exhibits no edema.  Neurological: He is alert and oriented to person, place, and time.  Skin: Skin is warm and dry.          Assessment & Plan:  [name] was seen today for b12 injection.  Diagnoses and all orders for this visit:  B12 deficiency  Other fatigue -     CBC w/Diff  Mixed hyperlipidemia -     Lipid Profile  Elevated BP -     COMPLETE METABOLIC PANEL WITH GFR  Elevated blood sugar -     HgB A1c   Will give b12 injection,   Will draw appropriate labs and address as indicated  Will continue current medication   Pt is being seen for a significant part of his health care at [facility] would be helpful to see what services they are providing to avoid duplication of care and/or  resources.    Will have pt return to clinic in 4 weeks for b12 injection and bp check  Will have return to clinic in 4 months for general status check.

## 2016-03-21 ENCOUNTER — Telehealth: Payer: Self-pay

## 2016-03-21 NOTE — Telephone Encounter (Signed)
Patient states he was to call back and let Butch Penny know the BP medication he was taking and had a reaction to he said it was HCTZ.

## 2016-04-24 ENCOUNTER — Other Ambulatory Visit: Payer: Self-pay

## 2016-04-29 ENCOUNTER — Other Ambulatory Visit: Payer: Self-pay

## 2016-04-30 ENCOUNTER — Ambulatory Visit: Payer: Self-pay

## 2016-04-30 DIAGNOSIS — D519 Vitamin B12 deficiency anemia, unspecified: Secondary | ICD-10-CM

## 2016-04-30 MED ORDER — CYANOCOBALAMIN 1000 MCG/ML IJ SOLN: 1000.0000 ug | Freq: Once | INTRAMUSCULAR | Status: DC

## 2016-04-30 MED ORDER — CYANOCOBALAMIN 1000 MCG/ML IJ SOLN
1000.0000 ug | Freq: Once | INTRAMUSCULAR | Status: AC
  Administered 2016-04-30: 1000 ug via INTRAMUSCULAR

## 2016-05-08 ENCOUNTER — Ambulatory Visit: Payer: Self-pay | Admitting: Family Medicine

## 2016-05-08 VITALS — BP 114/72 | HR 69 | Ht 65.5 in | Wt 125.0 lb

## 2016-05-08 DIAGNOSIS — R7303 Prediabetes: Secondary | ICD-10-CM

## 2016-05-08 DIAGNOSIS — K219 Gastro-esophageal reflux disease without esophagitis: Secondary | ICD-10-CM

## 2016-05-08 DIAGNOSIS — M7521 Bicipital tendinitis, right shoulder: Secondary | ICD-10-CM | POA: Insufficient documentation

## 2016-05-08 LAB — GLUCOSE, POCT (MANUAL RESULT ENTRY): POC Glucose: 97 mg/dl (ref 70–99)

## 2016-05-08 MED ORDER — OMEPRAZOLE 20 MG PO CPDR: 20.0000 mg | DELAYED_RELEASE_CAPSULE | Freq: Every day | ORAL | Status: DC

## 2016-05-08 NOTE — Progress Notes (Signed)
BP 114/72   Pulse 69   Ht 5' 5.5" (1.664 m)   Wt 125 lb (56.7 kg)   SpO2 97%   BMI 20.48 kg/m    Subjective:    Patient ID: Terry Tapia, male    DOB: 1961-05-31, 55 y.o.   MRN: [id number]  HPI: Terry Tapia is a 55 y.o. male  No chief complaint on file. 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  No flowsheet data found.  Relevant past medical, surgical, family and social history reviewed Past Medical History:  Diagnosis Date  . Asthma   . BPH without urinary obstruction   . Celiac disease   . COPD (chronic obstructive pulmonary disease) (HCC)    PER HISTORY, NORMAL SPIROMETRY OFF MEDS IN OUR OFFICE  . Diverticulosis   . Elevated alkaline phosphatase level 10/23/14  . 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 GERD (gastroesophageal reflux disease)    NORMAL EGD 01/02/15  . Glaucoma   . Headache    Migraines  . Helicobacter pylori gastritis   . Liver function study, abnormal 10/23/14  . Phimosis   . Thrombocytosis ([place])   . Vitamin B12 deficiency anemia    Blood transfusion 2012   Past Surgical History:  Procedure Laterality Date  . COLONOSCOPY  01/02/15   Diverticulosis in ascending colon, no polyps  . EYE SURGERY Left    Squamous cell cancer reomved   Family History  Problem Relation Age of Onset  . Hypertension Mother   . Lung disease Mother   . GI Disease  Mother   . Diabetes Father   . Hypertension Father   . Thyroid disease Father   . Heart disease Father    Social History  Substance Use Topics  . Smoking status: Never Smoker  . Smokeless tobacco: Never Used  . Alcohol use No    Interim medical history since last visit reviewed. Allergies and medications reviewed  Review of Systems Per HPI unless specifically indicated above     Objective:    BP 114/72   Pulse 69   Ht 5' 5.5" (1.664 m)   Wt 125 lb (56.7 kg)   SpO2 97%   BMI 20.48 kg/m   Wt Readings from Last 3 Encounters:  05/08/16 125 lb (56.7 kg)  03/18/16 127 lb (57.6 kg)  02/07/16 128 lb (58.1 kg)    Physical Exam  Constitutional: He appears well-developed and well-nourished. No distress.  Cardiovascular: Normal rate.   Pulmonary/Chest: Effort normal.  Abdominal: He exhibits no distension.  Musculoskeletal:       Right elbow: He exhibits decreased range of motion. He exhibits no swelling and no effusion. Tenderness found.  Biceps tendon  Skin: No rash noted. No pallor.  Psychiatric:  He has a normal mood and affect.    Results for orders placed or performed in visit on 05/08/16  POCT Glucose (CBG)  Result Value Ref Range   POC Glucose 97 70 - 99 mg/dl      Assessment & Plan:   Problem List Items Addressed This Visit      Digestive   GERD (gastroesophageal reflux disease)    Continue ranitidine; cut back on PPI to just once a day; if tolerated, would prefer less PPI; but if stomach upset, then go back to twice a day      Relevant Medications   omeprazole (PRILOSEC) 20 MG capsule     Musculoskeletal and Integument   Biceps tendinitis on right - Primary    Other Visit Diagnoses    Diabetes mellitus, latent ([place])       CMA entered for glucose; not main reason for visit   Relevant Orders   POCT Glucose (CBG) (Completed)      Follow up plan: Return if symptoms worsen or fail to improve.  An after-visit summary was printed and given to the  patient at [place].  Please see the patient instructions which may contain other information and recommendations beyond what is mentioned above in the assessment and plan.  Meds ordered this encounter  Medications  . omeprazole (PRILOSEC) 20 MG capsule    Sig: Take 1 capsule (20 mg total) by mouth daily.    Dispense:  30 capsule    Refill:  0    Orders Placed This Encounter  Procedures  . POCT Glucose (CBG)

## 2016-05-08 NOTE — Patient Instructions (Addendum)
Do start wearing a brace on the right wrist Go easy on the right elbow Ice pack on the right elbow 15-20 minutes at a time, 3-4 times a day for 5 days; always keep a protective cloth between the ice and the skin Cut back on the omeprazole to just once a day if tolerated Avoid the trigger foods  Gastroesophageal Reflux Disease, Adult Normally, food travels down the esophagus and stays in the stomach to be digested. However, when a person has gastroesophageal reflux disease (GERD), food and stomach acid move back up into the esophagus. When this happens, the esophagus becomes sore and inflamed. Over time, GERD can create small holes (ulcers) in the lining of the esophagus.  CAUSES This condition is caused by a problem with the muscle between the esophagus and the stomach (lower esophageal sphincter, or LES). Normally, the LES muscle closes after food passes through the esophagus to the stomach. When the LES is weakened or abnormal, it does not close properly, and that allows food and stomach acid to go back up into the esophagus. The LES can be weakened by certain dietary substances, medicines, and medical conditions, including:  Tobacco use.  Pregnancy.  Having a hiatal hernia.  Heavy alcohol use.  Certain foods and beverages, such as coffee, chocolate, onions, and peppermint. RISK FACTORS This condition is more likely to develop in:  People who have an increased body weight.  People who have connective tissue disorders.  People who use NSAID medicines. SYMPTOMS Symptoms of this condition include:  Heartburn.  Difficult or painful swallowing.  The feeling of having a lump in the throat.  Abitter taste in the mouth.  Bad breath.  Having a large amount of saliva.  Having an upset or bloated stomach.  Belching.  Chest pain.  Shortness of breath or wheezing.  Ongoing (chronic) cough or a night-time cough.  Wearing away of tooth enamel.  Weight loss. Different  conditions can cause chest pain. Make sure to see your health care provider if you experience chest pain. DIAGNOSIS Your health care provider will take a medical history and perform a physical exam. To determine if you have mild or severe GERD, your health care provider may also monitor how you respond to treatment. You may also have other tests, including:  An endoscopy toexamine your stomach and esophagus with a small camera.  A test thatmeasures the acidity level in your esophagus.  A test thatmeasures how much pressure is on your esophagus.  A barium swallow or modified barium swallow to show the shape, size, and functioning of your esophagus. TREATMENT The goal of treatment is to help relieve your symptoms and to prevent complications. Treatment for this condition may vary depending on how severe your symptoms are. Your health care provider may recommend:  Changes to your diet.  Medicine.  Surgery. HOME CARE INSTRUCTIONS Diet  Follow a diet as recommended by your health care provider. This may involve avoiding foods and drinks such as:  Coffee and tea (with or without caffeine).  Drinks that containalcohol.  Energy drinks and sports drinks.  Carbonated drinks or sodas.  Chocolate and cocoa.  Peppermint and mint flavorings.  Garlic and onions.  Horseradish.  Spicy and acidic foods, including peppers, chili powder, curry powder, vinegar, hot sauces, and barbecue sauce.  Citrus fruit juices and citrus fruits, such as oranges, lemons, and limes.  Tomato-based foods, such as red sauce, chili, salsa, and pizza with red sauce.  Fried and fatty foods, such as donuts,  french fries, potato chips, and high-fat dressings.  High-fat meats, such as hot dogs and fatty cuts of red and white meats, such as rib eye steak, sausage, ham, and bacon.  High-fat dairy items, such as whole milk, butter, and cream cheese.  Eat small, frequent meals instead of large meals.  Avoid  drinking large amounts of liquid with your meals.  Avoid eating meals during the 2-3 hours before bedtime.  Avoid lying down right after you eat.  Do not exercise right after you eat. General Instructions  Pay attention to any changes in your symptoms.  Take over-the-counter and prescription medicines only as told by your health care provider. Do not take aspirin, ibuprofen, or other NSAIDs unless your health care provider told you to do so.  Do not use any tobacco products, including cigarettes, chewing tobacco, and e-cigarettes. If you need help quitting, ask your health care provider.  Wear loose-fitting clothing. Do not wear anything tight around your waist that causes pressure on your abdomen.  Raise (elevate) the head of your bed 6 inches (15cm).  Try to reduce your stress, such as with yoga or meditation. If you need help reducing stress, ask your health care provider.  If you are overweight, reduce your weight to an amount that is healthy for you. Ask your health care provider for guidance about a safe weight loss goal.  Keep all follow-up visits as told by your health care provider. This is important. SEEK MEDICAL CARE IF:  You have new symptoms.  You have unexplained weight loss.  You have difficulty swallowing, or it hurts to swallow.  You have wheezing or a persistent cough.  Your symptoms do not improve with treatment.  You have a hoarse voice. SEEK IMMEDIATE MEDICAL CARE IF:  You have pain in your arms, neck, jaw, teeth, or back.  You feel sweaty, dizzy, or light-headed.  You have chest pain or shortness of breath.  You vomit and your vomit looks like blood or coffee grounds.  You faint.  Your stool is bloody or black.  You cannot swallow, drink, or eat.   This information is not intended to replace advice given to you by your health care provider. Make sure you discuss any questions you have with your health care provider.   Document Released:  08/13/2005 Document Revised: 07/25/2015 Document Reviewed: 02/28/2015 Elsevier Interactive Patient Education 2016 [employer] Biceps Tendon Tendinitis (Distal) With Rehab Tendinitis involves inflammation and pain over the affected tendon. The distal biceps tendon (near the elbow) is vulnerable to tendinitis. Distal biceps tendonitis is usually due to the bony bump near the elbow (bicipital tuberosity) causing increased friction over the tendon. The biceps tendon attaches the biceps muscle to one bone in the elbow and two in the shoulder. It is important for proper function of the elbow and for turning the palm upward (supination). SYMPTOMS   Pain, aching, tenderness, and sometimes warmth or redness over the front of the elbow.  Pain when bending the elbow or turning the palm up, using the wrist, especially if performed against resistance.  Crackling sound (crepitation) when the tendon or elbow is moved or touched. CAUSES  The symptoms of biceps tendonitis are due to inflammation of the tendon. Inflammation may be caused by:  Strain from sudden increase in amount or intensity of activity.  Direct blow or injury to the elbow (uncommon).  Overuse or repetitive elbow bending or wrist rotation, particularly when turning the palm up, or with elbow hyperextension. RISK INCREASES  WITH:  Sports that involve contact or overhead arm activity (throwing sports, gymnastics, weightlifting, bodybuilding, rock climbing).  Heavy labor.  Poor strength and flexibility.  Failure to warm up properly before activity.  Injury to other structures of the elbow.  Restraint of the elbow. PREVENTION  Warm up and stretch properly before activity.  Allow time for recovery between activities.  Maintain physical fitness:  Strength, flexibility, and endurance.  Cardiovascular fitness.  Learn and use proper exercise technique. PROGNOSIS  With proper treatment, biceps tendon tendonitis is usually  curable within 6 weeks.  RELATED COMPLICATIONS   Longer healing time if not properly treated or if not given enough time to heal.  Chronically inflamed tendon that causes persistent pain with activity, that may progress to constant pain and potentially rupture of the tendon.  Recurring symptoms, especially if activity is resumed too soon, with overuse or with poor technique. TREATMENT  Treatment first involves ice and medicine to reduce pain and inflammation. Modify activities that cause pain, to reduce the chances of causing the condition to get worse. Strengthening and stretching exercises should be performed to promote proper use of the muscles of the elbow. These exercise may be performed at home or with a therapist. Other treatments may be given such as ultrasound or heat therapy. Surgery is usually not recommended.  MEDICATION  If pain medicine is needed, nonsteroidal anti-inflammatory medicines (aspirin and ibuprofen), or other minor pain relievers (acetaminophen), are often advised.  Do not take pain relieving medication for 7 days before surgery.  Prescription pain relievers may be given if your caregiver thinks they are needed. Use only as directed and only as much as you need. HEAT AND COLD:   Cold treatment (icing) should be applied for 10 to 15 minutes every 2 to 3 hours for inflammation and pain, and immediately after activity that aggravates your symptoms. Use ice packs or an ice massage.  Heat treatment may be used before performing stretching and strengthening activities prescribed by your caregiver, physical therapist, or athletic trainer. Use a heat pack or a warm water soak. SEEK MEDICAL CARE IF:   Symptoms get worse or do not improve in 2 weeks, despite treatment.  New, unexplained symptoms develop. (Drugs used in treatment may produce side effects.) EXERCISES  RANGE OF MOTION (ROM) AND STRETCHING EXERCISES - Biceps Tendon Tendinitis (Distal) These exercises may help  you when beginning to rehabilitate your injury. Your symptoms may go away with or without further involvement from your physician, physical therapist, or athletic trainer. While completing these exercises, remember:   Restoring tissue flexibility helps normal motion to return to the joints. This allows healthier, less painful movement and activity.  An effective stretch should be held for at least 30 seconds.  A stretch should never be painful. You should only feel a gentle lengthening or release in the stretched tissue. STRETCH - Elbow Flexors   Lie on a firm bed or countertop on your back. Be sure that you are in a comfortable position which will allow you to relax your arm muscles.  Place a folded towel under your right / left upper arm, so that your elbow and shoulder are at the same height. Extend your arm; your elbow should not rest on the bed or towel.  Allow the weight of your hand to straighten your elbow. Keep your arm and chest muscles relaxed. Your caretaker may ask you to increase the intensity of your stretch by adding a small wrist or hand weight.  Hold  for __________ seconds. You should feel a stretch on the inside of your elbow. Slowly return to the starting position. Repeat __________ times. Complete this exercise __________ times per day. RANGE OF MOTION - Supination, Active   Stand or sit with your elbows at your side. Bend your right / left elbow to 90 degrees.  Turn your palm upward until you feel a gentle stretch on the inside of your forearm.  Hold this position for __________ seconds. Slowly release and return to the starting position. Repeat __________ times. Complete this stretch __________ times per day.  RANGE OF MOTION - Pronation, Active   Stand or sit with your elbows at your side. Bend your right / left elbow to 90 degrees.  Turn your palm downward until you feel a gentle stretch on the top of your forearm.  Hold this position for __________ seconds.  Slowly release and return to the starting position. Repeat __________ times. Complete this stretch __________ times per day.  STRENGTHENING EXERCISES - Biceps Tendon Tendinitis (Distal) These exercises may help you when beginning to rehabilitate your injury. They may resolve your symptoms with or without further involvement from your physician, physical therapist or athletic trainer. While completing these exercises, remember:   Muscles can gain both the endurance and the strength needed for everyday activities through controlled exercises.  Complete these exercises as instructed by your physician, physical therapist or athletic trainer. Increase the resistance and repetitions only as guided.  You may experience muscle soreness or fatigue, but the pain or discomfort you are trying to eliminate should never get worse during these exercises. If this pain does get worse, stop and make sure you are following the directions exactly. If the pain is still present after adjustments, discontinue the exercise until you can discuss the trouble with your clinician. STRENGTH - Elbow Flexors, Isometric   Stand or sit upright on a firm surface. Place your right / left arm so that your hand is palm-up and at the height of your waist.  Place your opposite hand on top of your forearm. Gently push down as your right / left arm resists. Push as hard as you can with both arms without causing any pain or movement at your right / left elbow. Hold this stationary position for __________ seconds.  Gradually release the tension in both arms. Allow your muscles to relax completely before repeating. Repeat __________ times. Complete this exercise __________ times per day. STRENGTH - Forearm Supinators   Sit with your right / left forearm supported on a table, keeping your elbow below shoulder height. Rest your hand over the edge, palm down.  Gently grip a hammer or a soup ladle.  Without moving your elbow, slowly turn  your palm and hand upward to a "thumbs-up" position.  Hold this position for __________ seconds. Slowly return to the starting position. Repeat __________ times. Complete this exercise __________ times per day.  STRENGTH - Forearm Pronators   Sit with your right / left forearm supported on a table, keeping your elbow below shoulder height. Rest your hand over the edge, palm up.  Gently grip a hammer or a soup ladle.  Without moving your elbow, slowly turn your palm and hand upward to a "thumbs-up" position.  Hold this position for __________ seconds. Slowly return to the starting position. Repeat __________ times. Complete this exercise __________ times per day.  STRENGTH - Elbow Flexors, Supinated  With good posture, stand or sit on a firm chair without armrests. Allow your  right / left arm to rest at your side with your palm facing forward.  Holding a __________ weight, or gripping a rubber exercise band or tubing, bring your hand toward your shoulder.  Allow your muscles to control the resistance as your hand returns to your side. Repeat __________ times. Complete this exercise __________ times per day.  STRENGTH - Elbow Flexors, Neutral  With good posture, stand or sit on a firm chair without armrests. Allow your right / left arm to rest at your side with your thumb facing forward.  Holding a __________ weight, or gripping a rubber exercise band or tubing, bring your hand toward your shoulder.  Allow your muscles to control the resistance as your hand returns to your side. Repeat __________ times. Complete this exercise __________ times per day.    This information is not intended to replace advice given to you by your health care provider. Make sure you discuss any questions you have with your health care provider.   Document Released: 11/03/2005 Document Revised: 11/24/2014 Document Reviewed: 02/15/2009 Elsevier Interactive Patient Education [employer].

## 2016-05-08 NOTE — Assessment & Plan Note (Signed)
Continue ranitidine; cut back on PPI to just once a day; if tolerated, would prefer less PPI; but if stomach upset, then go back to twice a day

## 2016-05-27 ENCOUNTER — Other Ambulatory Visit: Payer: Self-pay

## 2016-05-29 ENCOUNTER — Other Ambulatory Visit: Payer: Self-pay

## 2016-06-24 ENCOUNTER — Other Ambulatory Visit: Payer: Self-pay

## 2016-06-24 NOTE — Progress Notes (Signed)
1000 mcg b12 given in L deltoid.  Tolerated without difficulty or witnessed adverse effect.

## 2016-07-24 ENCOUNTER — Other Ambulatory Visit: Payer: Self-pay

## 2016-07-24 DIAGNOSIS — D519 Vitamin B12 deficiency anemia, unspecified: Secondary | ICD-10-CM

## 2016-07-24 MED ORDER — CYANOCOBALAMIN 1000 MCG/ML IJ SOLN
1000.0000 ug | Freq: Once | INTRAMUSCULAR | Status: AC
  Administered 2016-07-24: 1000 ug via INTRAMUSCULAR

## 2016-07-24 NOTE — Progress Notes (Signed)
B12 injection given in right deltoid. PT tolerated well. appt made for next b12.

## 2016-07-28 ENCOUNTER — Other Ambulatory Visit: Payer: Self-pay | Admitting: Urology

## 2016-07-28 NOTE — Telephone Encounter (Signed)
Please advise 

## 2016-08-21 ENCOUNTER — Other Ambulatory Visit: Payer: Self-pay

## 2016-08-28 ENCOUNTER — Other Ambulatory Visit: Payer: Self-pay

## 2016-09-02 ENCOUNTER — Telehealth: Payer: Self-pay

## 2016-09-02 NOTE — Telephone Encounter (Signed)
Called pt to reschedule b12 injections. No answer. Left msg to have pt call back.

## 2016-09-04 ENCOUNTER — Other Ambulatory Visit: Payer: Self-pay

## 2016-09-04 DIAGNOSIS — D518 Other vitamin B12 deficiency anemias: Secondary | ICD-10-CM

## 2016-09-04 DIAGNOSIS — D519 Vitamin B12 deficiency anemia, unspecified: Secondary | ICD-10-CM

## 2016-09-04 NOTE — Progress Notes (Signed)
Patient is due for his B12 shot. Shot given today in L deltoid. Return 1 month for repeat B12 shot

## 2016-09-04 NOTE — Assessment & Plan Note (Signed)
B12 shot given today. Return in 1 month for repeat B12 injection

## 2016-10-07 ENCOUNTER — Other Ambulatory Visit: Payer: Self-pay

## 2016-10-14 ENCOUNTER — Other Ambulatory Visit: Payer: Self-pay | Admitting: Nurse Practitioner

## 2016-10-14 DIAGNOSIS — D519 Vitamin B12 deficiency anemia, unspecified: Secondary | ICD-10-CM

## 2016-10-14 MED ORDER — CYANOCOBALAMIN 1000 MCG/ML IJ SOLN
1000.0000 ug | Freq: Once | INTRAMUSCULAR | Status: AC
  Administered 2016-11-13: 1000 ug via INTRAMUSCULAR

## 2016-10-14 NOTE — Progress Notes (Signed)
Lot [id number], exp 08/18  Given in L deltoid                                                                                                          .Marland Kitchen...............................................Marland Kitchen

## 2016-10-15 ENCOUNTER — Other Ambulatory Visit: Payer: Self-pay | Admitting: Urology

## 2016-10-22 ENCOUNTER — Other Ambulatory Visit: Payer: Self-pay

## 2016-10-22 NOTE — Telephone Encounter (Signed)
Received refill request for dicyclomine 20 mg tablets.  Directions - take 1/2 tablet (10mg ) by mouth 4 times daily as needed for spasms/abdominal pain.  Pharmacy of choice is [facility].

## 2016-10-30 NOTE — Telephone Encounter (Signed)
Update on 10/30/16 - after checking with pharmacy [facility]), medication had already been filled.  Informed that it could be disregarded.

## 2016-10-31 ENCOUNTER — Telehealth: Payer: Self-pay | Admitting: Pharmacist

## 2016-10-31 NOTE — Telephone Encounter (Signed)
Symbicort PAPrefill called in to manufacturer today.

## 2016-11-06 ENCOUNTER — Other Ambulatory Visit: Payer: Self-pay | Admitting: Urology

## 2016-11-07 ENCOUNTER — Other Ambulatory Visit: Payer: Self-pay | Admitting: Urology

## 2016-11-13 ENCOUNTER — Other Ambulatory Visit: Payer: Self-pay

## 2016-11-13 DIAGNOSIS — Z0000 Encounter for general adult medical examination without abnormal findings: Secondary | ICD-10-CM

## 2016-11-13 NOTE — Progress Notes (Unsigned)
B12 injection given in right deltoid. PT tolerated well.

## 2016-12-10 ENCOUNTER — Other Ambulatory Visit: Payer: Self-pay | Admitting: Internal Medicine

## 2016-12-11 ENCOUNTER — Other Ambulatory Visit: Payer: Self-pay

## 2016-12-11 DIAGNOSIS — D519 Vitamin B12 deficiency anemia, unspecified: Secondary | ICD-10-CM

## 2016-12-11 NOTE — Telephone Encounter (Signed)
Received PAP application from [facility] for Symbicort placed for provider to sign. 

## 2017-01-08 ENCOUNTER — Other Ambulatory Visit: Payer: Self-pay

## 2017-01-08 ENCOUNTER — Ambulatory Visit: Payer: Self-pay

## 2020-04-10 ENCOUNTER — Ambulatory Visit: Payer: Self-pay | Attending: Internal Medicine

## 2020-04-10 DIAGNOSIS — Z23 Encounter for immunization: Secondary | ICD-10-CM

## 2020-04-10 NOTE — Progress Notes (Signed)
   [facility]ovid[facility] Vaccination [facility]  Name:  Terry Tapia    MRN: [id number] DOB: 1960/12/18  04/10/2020  [name] was observed post Covid-19 immunization for 30 minutes based on pre-vaccination screening without incident. He was provided with Vaccine Information Sheet and instruction to access the V-Safe system.   [name] was instructed to call 911 with any severe reactions post vaccine: Marland Kitchen Difficulty breathing  . Swelling of face and throat  . A fast heartbeat  . A bad rash all over body  . Dizziness and weakness   Immunizations Administered    Name Date Dose VIS Date Route   Pfizer COVID-19 Vaccine 04/10/2020  6:12 PM 0.3 mL 01/11/2019 Intramuscular   Manufacturer: [place]   Lot: [id number]   [place]: [id number]

## 2020-05-01 ENCOUNTER — Ambulatory Visit: Payer: Self-pay | Attending: Internal Medicine

## 2020-05-01 DIAGNOSIS — Z23 Encounter for immunization: Secondary | ICD-10-CM

## 2020-05-01 NOTE — Progress Notes (Signed)
   Covid-19 Vaccination Clinic  Name:  Terry Tapia    MRN: [id number] DOB: [birth year]  05/01/2020  [name] was observed post Covid-19 immunization for 15 minutes without incident. He was provided with Vaccine Information Sheet and instruction to access the V-Safe system.   [name] was instructed to call 911 with any severe reactions post vaccine: Marland Kitchen Difficulty breathing  . Swelling of face and throat  . A fast heartbeat  . A bad rash all over body  . Dizziness and weakness   Immunizations Administered    Name Date Dose VIS Date Route   Pfizer COVID-19 Vaccine 05/01/2020  3:55 PM 0.3 mL 01/11/2019 Intramuscular   Manufacturer: [place]   Lot: [id number]   [place]: [id number]

## 2021-12-11 ENCOUNTER — Encounter: Payer: Self-pay | Admitting: Emergency Medicine

## 2021-12-11 ENCOUNTER — Ambulatory Visit
Admission: EM | Admit: 2021-12-11 | Discharge: 2021-12-11 | Disposition: A | Discharge status: Home / Self Care | Payer: Self-pay | Attending: Urgent Care | Admitting: Urgent Care

## 2021-12-11 ENCOUNTER — Other Ambulatory Visit: Payer: Self-pay

## 2021-12-11 DIAGNOSIS — M79601 Pain in right arm: Secondary | ICD-10-CM

## 2021-12-11 DIAGNOSIS — W540XXA Bitten by dog, initial encounter: Secondary | ICD-10-CM

## 2021-12-11 DIAGNOSIS — Z23 Encounter for immunization: Secondary | ICD-10-CM

## 2021-12-11 MED ORDER — TETANUS-DIPHTH-ACELL PERTUSSIS 5-2.5-18.5 LF-MCG/0.5 IM SUSY
0.5000 mL | PREFILLED_SYRINGE | Freq: Once | INTRAMUSCULAR | Status: AC
  Administered 2021-12-11: 20:00:00 0.5 mL via INTRAMUSCULAR

## 2021-12-11 MED ORDER — ACETAMINOPHEN 325 MG PO TABS: 650.0000 mg | ORAL_TABLET | Freq: Four times a day (QID) | ORAL | 0 refills | Status: DC | PRN

## 2021-12-11 NOTE — ED Triage Notes (Signed)
Pt here with possible dog bite to right shoulder, but pt states he is unsure if it is actually a bite. Pt states the dog is not known to him, but the owner verbally stated the dog is up to date on his shots. Pt has yet to have actual documentation and advised that he should seek that as soon as possible.

## 2021-12-11 NOTE — ED Provider Notes (Signed)
Renae Gloss   MRN: [id number] DOB: 23-Apr-1961  Subjective:   Terry Tapia is a 61 y.o. male presenting for dog bite to the right upper arm suffered today about 3 hours ago.  Patient believes that the dog is vaccinated.  However, they are still in the process of confirming this for sure.  Cannot recall his last Tdap.  No bleeding.  Patient did have the wound cleaned at home, use soap and water and alcohol.  No current facility-administered medications for this encounter.  Current Outpatient Medications:    acetaminophen (TYLENOL) 500 MG tablet, Take 500 mg by mouth., Disp: , Rfl:    albuterol (PROVENTIL HFA;VENTOLIN HFA) 108 (90 Base) MCG/ACT inhaler, Inhale 2 puffs into the lungs every 6 (six) hours as needed for wheezing or shortness of breath., Disp: 1 Inhaler, Rfl: 12   Cyanocobalamin (B-12 COMPLIANCE INJECTION IJ), Inject 1,000 Units as directed every 28 (twenty-eight) days. 1 injection every month., Disp: , Rfl:    dicyclomine (BENTYL) 10 MG capsule, Take 1 capsule by mouth 4 times daily as needed for spasms/abdominal pain., Disp: 120 capsule, Rfl: 0   dicyclomine (BENTYL) 20 MG tablet, Take 1/2 TABLET (10MG ) by mouth 4 times daily as needed for spasms/abdominal pain., Disp: 60 tablet, Rfl: 0   fluticasone (FLONASE) 50 MCG/ACT nasal spray, Place 2 sprays into both nostrils daily., Disp: 16 g, Rfl: 0   hydrochlorothiazide (HYDRODIURIL) 25 MG tablet, Take 1 tablet (25 mg total) by mouth daily., Disp: 30 tablet, Rfl: 3   loratadine (CLARITIN) 10 MG tablet, Take 1 tablet (10 mg total) by mouth daily., Disp: 90 tablet, Rfl: 3   montelukast (SINGULAIR) 10 MG tablet, Take 1 tablet (10 mg total) by mouth at bedtime., Disp: 90 tablet, Rfl: 3   omeprazole (PRILOSEC) 20 MG capsule, Take 1 capsule (20 mg total) by mouth daily., Disp: 30 capsule, Rfl: 0   ranitidine (ZANTAC) 150 MG capsule, Take 1 capsule (150 mg total) by mouth 2 (two) times daily., Disp: 60 capsule, Rfl: 3    SYMBICORT 160-4.5 MCG/ACT inhaler, INHALE 2 PUFFS 2 TIMES A DAY., Disp: 10.2 g, Rfl: 0   tamsulosin (FLOMAX) 0.4 MG CAPS capsule, Take 1 capsule (0.4 mg total) by mouth 2 (two) times daily., Disp: 60 capsule, Rfl: 12   triamcinolone (NASACORT) 55 MCG/ACT AERO nasal inhaler, Place 2 sprays into the nose daily. 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   GERD (gastroesophageal reflux disease)    NORMAL EGD 01/02/15   Glaucoma    Headache    Migraines   Helicobacter pylori gastritis    Liver function study, abnormal 10/23/14   Phimosis    Thrombocytosis    Vitamin B12 deficiency anemia    Blood transfusion 2012     Past Surgical History:  Procedure Laterality Date   COLONOSCOPY  01/02/15   Diverticulosis in ascending colon, no polyps   EYE SURGERY Left    Squamous cell cancer reomved    Family History  Problem Relation Age of Onset  Hypertension Mother    Lung disease Mother    GI Disease Mother    Diabetes Father    Hypertension Father    Thyroid disease Father    Heart disease Father     Social History   Tobacco Use   Smoking status: Never   Smokeless tobacco: Never  Substance Use Topics   Alcohol use: No   Drug use: No    ROS   Objective:   Vitals: BP (!) 167/91    Pulse (!) 124    Temp 98 F (36.7 C)    Resp 20    SpO2 97%   Physical Exam Constitutional:      General: He is not in acute distress.    Appearance: Normal appearance. He is well-developed and normal weight. He is not ill-appearing, toxic-appearing or diaphoretic.  HENT:     Head: Normocephalic  and atraumatic.     Right Ear: External ear normal.     Left Ear: External ear normal.     Nose: Nose normal.     Mouth/Throat:     Pharynx: Oropharynx is clear.  Eyes:     General: No scleral icterus.       Right eye: No discharge.        Left eye: No discharge.     Extraocular Movements: Extraocular movements intact.  Cardiovascular:     Rate and Rhythm: Normal rate.  Pulmonary:     Effort: Pulmonary effort is normal.  Musculoskeletal:       Arms:     Cervical back: Normal range of motion.  Neurological:     Mental Status: He is alert and oriented to person, place, and time.  Psychiatric:        Mood and Affect: Mood normal.        Behavior: Behavior normal.        Thought Content: Thought content normal.        Judgment: Judgment normal.       Assessment and Plan :   PDMP not reviewed this encounter.  1. Right arm pain   2. Need for diphtheria-tetanus-pertussis (Tdap) vaccine   3. Dog bite, initial encounter     Will hold off on antibiotics for now. Tdap updated in clinic. Use Tylenol for pain relief. Wound care reviewed. Counseled patient on potential for adverse effects with medications prescribed/recommended today, ER and return-to-clinic precautions discussed, patient verbalized understanding.    Jaynee Eagles, PA-C 12/11/21 2008

## 2021-12-11 NOTE — Discharge Instructions (Addendum)
Please make sure you keep your wound covered with a nonstick dressing and Coban.  Do this through the weekend.  Use Tylenol for your aches and pains.  If you develop redness, drainage of pus, fevers then come back to our clinic as these are signs of infection and need to be addressed with an antibiotic.

## 2023-02-25 ENCOUNTER — Encounter: Payer: Self-pay | Admitting: Internal Medicine

## 2023-02-26 ENCOUNTER — Encounter: Payer: Self-pay | Admitting: Internal Medicine

## 2023-02-27 ENCOUNTER — Ambulatory Visit: Payer: Medicare HMO | Attending: Internal Medicine | Admitting: Internal Medicine

## 2023-02-27 ENCOUNTER — Encounter: Payer: Self-pay | Admitting: Internal Medicine

## 2023-02-27 ENCOUNTER — Other Ambulatory Visit
Admission: RE | Admit: 2023-02-27 | Discharge: 2023-02-27 | Disposition: A | Discharge status: Home / Self Care | Payer: Medicare HMO | Source: Ambulatory Visit | Attending: Internal Medicine | Admitting: Internal Medicine

## 2023-02-27 VITALS — BP 128/70 | HR 84 | Ht 65.0 in | Wt 131.2 lb

## 2023-02-27 DIAGNOSIS — R072 Precordial pain: Secondary | ICD-10-CM

## 2023-02-27 DIAGNOSIS — R0602 Shortness of breath: Secondary | ICD-10-CM

## 2023-02-27 DIAGNOSIS — I10 Essential (primary) hypertension: Secondary | ICD-10-CM

## 2023-02-27 DIAGNOSIS — Z720 Tobacco use: Secondary | ICD-10-CM

## 2023-02-27 LAB — BASIC METABOLIC PANEL
Anion gap: 11 (ref 5–15)
BUN: 9 mg/dL (ref 8–23)
CO2: 24 mmol/L (ref 22–32)
Calcium: 9.2 mg/dL (ref 8.9–10.3)
Chloride: 103 mmol/L (ref 98–111)
Creatinine, Ser: 0.71 mg/dL (ref 0.61–1.24)
GFR, Estimated: >60 mL/min (ref >60)
Glucose, Bld: 87 mg/dL (ref 70–99)
Potassium: 3.8 mmol/L (ref 3.5–5.1)
Sodium: 138 mmol/L (ref 135–145)

## 2023-02-27 MED ORDER — METOPROLOL TARTRATE 100 MG PO TABS: ORAL_TABLET | ORAL | 0 refills | Status: DC

## 2023-02-27 NOTE — Patient Instructions (Addendum)
Medication Instructions:  Your Physician recommend you continue on your current medication as directed.    *If you need a refill on your cardiac medications before your next appointment, please call your pharmacy*   Lab Work: Your provider would like for you to have following labs drawn: (BMP).   Please go to the [facility] entrance and check in at the front desk.  You do not need an appointment.  They are open from 7am-6 pm.     Testing/Procedures: Your physician has requested that you have an echocardiogram. Echocardiography is a painless test that uses sound waves to create images of your heart. It provides your doctor with information about the size and shape of your heart and how well your heart's chambers and valves are working.   You may receive an ultrasound enhancing agent through an IV if needed to better visualize your heart during the echo. This procedure takes approximately one hour.  There are no restrictions for this procedure.  This will take place at [address] ([facility]) [phone], [place] [address]   Cardiac CT Angiography (CTA), is a special type of CT scan that uses a computer to produce multi-dimensional views of major blood vessels throughout the body. In CT angiography, a contrast material is injected through an IV to help visualize the blood vessels  Please see instructions below  Follow-Up: At [facility], you and your health needs are our priority.  As part of our continuing mission to provide you with exceptional heart care, we have created designated Provider Care Teams.  These Care Teams include your primary Cardiologist (physician) and Advanced Practice Providers (APPs -  Physician Assistants and Nurse Practitioners) who all work together to provide you with the care you need, when you need it.  We recommend signing up for the patient portal called "MyChart".  Sign up information is provided on this After Visit Summary.   MyChart is used to connect with patients for Virtual Visits (Telemedicine).  Patients are able to view lab/test results, encounter notes, upcoming appointments, etc.  Non-urgent messages can be sent to your provider as well.   To learn more about what you can do with MyChart, go to [email or web address].    Your next appointment:   1 month(s)  Provider:   You may see Yvonne Kendall, MD or one of the following Advanced Practice Providers on your designated Care Team:   Nicolasa Ducking, NP Eula Listen, PA-C Cadence Fransico Michael, PA-C Charlsie Quest, NP      Your cardiac CT will be scheduled at one of the below locations:   [facility] [address] [place], [place] [address] [phone]  OR   [facility] [address] [place], [place] [address] [phone]  If scheduled at [facility], please arrive at the [facility] and Children's Entrance (Entrance C2) of [facility] 30 minutes prior to test start time. You can use the FREE valet parking offered at entrance C (encouraged to control the heart rate for the test)  Proceed to the [facility] Radiology Department (first floor) to check-in and test prep.  All radiology patients and guests should use entrance C2 at [facility], accessed from [facility], even though the hospital's physical address listed is [address]    If scheduled at [facility] or [facility], please arrive 15 mins early for check-in and test prep.  Please follow these instructions carefully (unless otherwise directed):  Hold all erectile dysfunction medications at least 3 days (72 hrs) prior to test. (Ie viagra, cialis, sildenafil, tadalafil, etc) We will administer nitroglycerin during this exam.   On the Night Before the Test: Be sure to Drink plenty of water. Do not consume any  caffeinated/decaffeinated beverages or chocolate 12 hours prior to your test. Do not take any antihistamines 12 hours prior to your test.   On the Day of the Test: Drink plenty of water until 1 hour prior to the test. Do not eat any food 1 hour prior to test. You may take your regular medications prior to the test.  Take metoprolol (Lopressor) 100 mg  two hours prior to test. If you take Furosemide/Hydrochlorothiazide/Spironolactone, please HOLD on the morning of the test. FEMALES- please wear underwire-free bra if available, avoid dresses & tight clothing       After the Test: Drink plenty of water. After receiving IV contrast, you may experience a mild flushed feeling. This is normal. On occasion, you may experience a mild rash up to 24 hours after the test. This is not dangerous. If this occurs, you can take Benadryl 25 mg and increase your fluid intake. If you experience trouble breathing, this can be serious. If it is severe call 911 IMMEDIATELY. If it is mild, please call our office. If you take any of these medications: Glipizide/Metformin, Avandament, Glucavance, please do not take 48 hours after completing test unless otherwise instructed.  We will call to schedule your test 2-4 weeks out understanding that some insurance companies will need an authorization prior to the service being performed.   For non-scheduling related questions, please contact the cardiac imaging nurse navigator should you have any questions/concerns: Rockwell Alexandria, Cardiac Imaging Nurse Navigator Larey Brick, Cardiac Imaging Nurse Navigator [facility] Direct Office Dial: [phone]   For scheduling needs, including cancellations and rescheduling, please call [place], [phone].

## 2023-02-27 NOTE — Progress Notes (Unsigned)
New Outpatient Visit Date: 02/27/2023  Referring Provider: Dudley Major, FNP [address] [place],  [place] [address]  Chief Complaint: Chest pain  HPI:  Terry Tapia is a 62 y.o. male who is being seen today for the evaluation of chest pain and abnormal EKG at the request of Ms. Wood. He has a history of hypertension, asthma/COPD, celiac disease,, and GERD.  He reports a 1 to 68-month history of intermittent chest pain.  It seems to be located mostly near the left shoulder but cannot radiate towards the left breast.  It is sometimes sharp but more often a burning sensation that can last 2 to 3 days at a time.  Terry Tapia also has chronic low back pain that often radiates to his upper back between his shoulder blades.  This also has a burning quality, and it is unclear if some of what he feels near the left shoulder and breast is actually radiation from the back.  He notes that his back pain began after a work accident in 2021 or 2022.  He also has severe pain in his legs that limits his mobility.  He notes chronic shortness of breath.  He has fallen several times on account of his right leg "giving out."  He sometimes feels "funny headed," which he attributes to certain medications (unable to provide specific names) though he has not passed out.  He underwent exercise stress testing at [facility] in 2016 for evaluation of atypical chest pain.  The study was nondiagnostic due to failure to achieve target heart rate.  Echocardiogram at that time showed mildly reduced LVEF of 50% but no other significant structural abnormalities.  --------------------------------------------------------------------------------------------------  Cardiovascular History & Procedures: Cardiovascular Problems: Atypical chest pain  Risk Factors: Hypertension, male gender, age greater than 59, and tobacco use  Cath/PCI: None  CV Surgery: None  EP Procedures and Devices: None  Non-Invasive Evaluation(s): TTE  (06/26/2015): Normal LV size and wall thickness.  LVEF 50%.  No significant valvular abnormalities. Exercise tolerance test (06/26/2019 2016): Submaximal heart rate (71% MPHR); patient with decreased exercise capacity due to dyspnea and fatigue.  Recent CV Pertinent Labs: Lab Results  Component Value Date   BNP 114 08/21/2014   K 3.8 02/27/2023   K 3.6 08/21/2014   BUN 9 02/27/2023   BUN 6 04/24/2015   BUN 8 08/21/2014   CREATININE 0.71 02/27/2023   CREATININE 0.82 08/21/2014    --------------------------------------------------------------------------------------------------  Past Medical History:  Diagnosis Date   Asthma    BPH without urinary obstruction    Celiac disease    COPD (chronic obstructive pulmonary disease)    PER HISTORY, NORMAL SPIROMETRY OFF MEDS IN OUR OFFICE   Diverticulosis    Elevated alkaline phosphatase level 10/23/2014   Eye cancer    L EYE- SQUAMOUS CELL   GERD (gastroesophageal reflux disease)    NORMAL EGD 01/02/15   Glaucoma    Headache    Migraines   Helicobacter pylori gastritis    Hypertension    Liver function study, abnormal 10/23/2014   Phimosis    Thrombocytosis    Vitamin B12 deficiency anemia    Blood transfusion 2012    Past Surgical History:  Procedure Laterality Date   COLONOSCOPY  01/02/15   Diverticulosis in ascending colon, no polyps   EYE SURGERY Left    Squamous cell cancer reomved    Current Meds  Medication Sig   albuterol (PROVENTIL HFA;VENTOLIN HFA) 108 (90 Base) MCG/ACT inhaler Inhale 2 puffs into  the lungs every 6 (six) hours as needed for wheezing or shortness of breath.   dicyclomine (BENTYL) 20 MG tablet Take 1/2 TABLET ( ) by mouth 4 times daily as needed for spasms/abdominal pain.   fluticasone (FLONASE) 50 MCG/ACT nasal spray Place 2 sprays into both nostrils daily.   loratadine (CLARITIN) 10 MG tablet Take 1 tablet (10 mg total) by mouth daily.   metoprolol tartrate (LOPRESSOR) 100 MG tablet TAKE 1 TABLET  2 HR PRIOR TO CARDIAC PROCEDURE   montelukast (SINGULAIR) 10 MG tablet Take 1 tablet (10 mg total) by mouth at bedtime.   pantoprazole (PROTONIX) 40 MG tablet Take 40 mg by mouth daily.   SYMBICORT 160-4.5 MCG/ACT inhaler INHALE 2 PUFFS 2 TIMES A DAY.   tamsulosin (FLOMAX) 0.4 MG CAPS capsule Take 1 capsule (0.4 mg total) by mouth 2 (two) times daily.   [DISCONTINUED] hydrochlorothiazide (HYDRODIURIL) 25 MG tablet Take 1 tablet (25 mg total) by mouth daily.    Allergies: Penicillins  Social History   Tobacco Use   Smoking status: Never   Smokeless tobacco: Current    Types: Chew   Tobacco comments:    Intermittent chewing tobacco use  Vaping Use   Vaping Use: Never used  Substance Use Topics   Alcohol use: No   Drug use: No    Family History  Problem Relation Age of Onset   Hypertension Mother    Lung disease Mother    GI Disease Mother    Diabetes Father    Hypertension Father    Thyroid disease Father    Stroke Father    Peripheral Artery Disease Father     Review of Systems: A 12-system review of systems was performed and was negative except as noted in the HPI.  --------------------------------------------------------------------------------------------------  Physical Exam: BP 128/70 (BP Location: Right Arm, Patient Position: Sitting, Cuff Size: Normal)   Pulse 84   Ht  (1.651 m)   Wt 131 lb 4 oz (59.5 kg)   SpO2 95%   BMI 21.84 kg/m   General: NAD.  Accompanied by his sister. HEENT: No conjunctival pallor or scleral icterus. Neck: Supple without lymphadenopathy, thyromegaly, JVD, or HJR. No carotid bruit. Lungs: Normal work of breathing. Clear to auscultation bilaterally without wheezes or crackles. Heart: Regular rate and rhythm without murmurs, rubs, or gallops. Non-displaced PMI. Abd: Bowel sounds present. Soft, NT/ND without hepatosplenomegaly Ext: No lower extremity edema. Radial, PT, and DP pulses are 2+ bilaterally Skin: Warm and dry without  rash. Neuro: CNIII-XII intact.  4+/5 upper and lower extremity strength bilaterally.  Normal fine touch sensation in upper and lower extremities. Psych: Normal mood and affect.  EKG: Normal sinus rhythm with left axis deviation.  No significant change from prior tracing on 08/21/2014.  Outside labs (12/25/2022): CBC: WBC 7.0, Hgb 17.4, HCT 49.5, PLT 41  CMP: Sodium 145, potassium 5.1, chloride 104, CO2 25, BUN 8, creatinine 0.8, glucose 66, calcium 10.1, AST 27, ALT 29, alkaline phosphatase 130, total bilirubin less than 0.2, total protein 7.7, albumin 4.8  Hemoglobin A1c: 5.5%  Lipid panel: Total cholesterol 184, triglycerides 131, HDL 31, LDL 129  --------------------------------------------------------------------------------------------------  ASSESSMENT AND PLAN: Chest pain and shortness of breath: Overall quality of pain is atypical for ischemic heart disease though Terry Tapia has several cardiac risk factors including history of hypertension, male gender, and longstanding smokeless tobacco use.  His mobility is quite limited on account of his back pain and neuropathy, precluding exercise stress testing.  We have discussed  further evaluation options and have agreed to obtain a coronary CTA and echocardiogram.  We will defer medication changes at this time.  Hypertension: Blood pressure well-controlled today off pharmacologic therapy.  No further intervention at this time.  Hyperlipidemia: Outside lipid panel in February notable for LDL of 129.  Defer statin therapy at this time pending coronary CTA.  This will need to be readdressed if there is evidence of ASCVD.  Tobacco abuse: Terry Tapia continues to use smokeless tobacco.  I encouraged him to quit.  Follow-up: Return to clinic in 1 month.  Yvonne Kendall, MD 03/01/2023 4:50 PM

## 2023-03-01 ENCOUNTER — Encounter: Payer: Self-pay | Admitting: Internal Medicine

## 2023-03-03 ENCOUNTER — Encounter: Payer: Self-pay | Admitting: Internal Medicine

## 2023-03-03 ENCOUNTER — Telehealth: Payer: Self-pay | Admitting: Internal Medicine

## 2023-03-03 NOTE — Telephone Encounter (Signed)
Patient is returning call. Please advise? 

## 2023-03-03 NOTE — Telephone Encounter (Signed)
Attempted to reach Terry Tapia at home and mobile numbers listed in his chart but did not receive an answer.  Voicemail left on mobile number asking him to call the office to discuss his questions about coronary CTA in further detail.  Yvonne Kendall, MD [facility]

## 2023-03-03 NOTE — Telephone Encounter (Signed)
Coronary CTA discussed with Mr. Terry Tapia by phone.  His questions were answered; we will proceed as previously scheduled.  Yvonne Kendall, MD [facility]

## 2023-03-06 NOTE — Telephone Encounter (Signed)
See updated encounter

## 2023-03-10 ENCOUNTER — Encounter: Payer: Self-pay | Admitting: Internal Medicine

## 2023-03-18 ENCOUNTER — Encounter: Payer: Self-pay | Admitting: Internal Medicine

## 2023-03-19 ENCOUNTER — Encounter: Payer: Self-pay | Admitting: Internal Medicine

## 2023-03-20 ENCOUNTER — Encounter: Payer: Self-pay | Admitting: Internal Medicine

## 2023-03-23 ENCOUNTER — Encounter: Payer: Self-pay | Admitting: Internal Medicine

## 2023-03-24 ENCOUNTER — Telehealth (HOSPITAL_COMMUNITY): Payer: Self-pay | Admitting: *Deleted

## 2023-03-24 ENCOUNTER — Encounter: Payer: Self-pay | Admitting: Internal Medicine

## 2023-03-24 NOTE — Telephone Encounter (Signed)
This does not sound cardiac and would be further evaluated by the CTA that I have discussed with him multiple times.  If he remains concerned about the pain, he should see his primary care provider or go to urgent care.  Yvonne Kendall, MD [facility]

## 2023-03-24 NOTE — Telephone Encounter (Signed)
Called pt regarding mychart message. Pt stated he's been experiencing left sided pain when he take a deep breath or move to his left side.  Pt stated symptoms started roughly 2-3 days ago after working in his garden.  Pt stated he feels symptoms could be related to a pull muscle, but wanted to make sure. Pt denies any other symptoms.  Will forward to MD

## 2023-03-24 NOTE — Telephone Encounter (Signed)
Attempted to call patient regarding upcoming cardiac CT appointment. °Left message on voicemail with name and callback number ° °Solash Tullo RN Navigator Cardiac Imaging °[facility] °[phone] Office °[phone] Cell ° °

## 2023-03-26 ENCOUNTER — Ambulatory Visit
Admission: RE | Admit: 2023-03-26 | Discharge: 2023-03-26 | Disposition: A | Discharge status: Home / Self Care | Payer: Medicare HMO | Source: Ambulatory Visit | Attending: Internal Medicine | Admitting: Internal Medicine

## 2023-03-26 DIAGNOSIS — R072 Precordial pain: Secondary | ICD-10-CM | POA: Diagnosis present

## 2023-03-26 MED ORDER — NITROGLYCERIN 0.4 MG SL SUBL
0.8000 mg | SUBLINGUAL_TABLET | Freq: Once | SUBLINGUAL | Status: AC
  Administered 2023-03-26: 0.8 mg via SUBLINGUAL

## 2023-03-26 MED ORDER — IOHEXOL 350 MG/ML SOLN
75.0000 mL | Freq: Once | INTRAVENOUS | Status: AC | PRN
  Administered 2023-03-26: 75 mL via INTRAVENOUS

## 2023-03-26 NOTE — Progress Notes (Signed)
Patient tolerated CT well. Drank water after. Vital signs stable encourage to drink water throughout day.Reasons explained and verbalized understanding. Ambulated steady gait.  

## 2023-03-27 ENCOUNTER — Encounter: Payer: Self-pay | Admitting: Internal Medicine

## 2023-03-30 ENCOUNTER — Encounter: Payer: Self-pay | Admitting: Internal Medicine

## 2023-04-03 ENCOUNTER — Encounter: Payer: Self-pay | Admitting: Internal Medicine

## 2023-04-10 ENCOUNTER — Ambulatory Visit: Payer: Medicare HMO | Attending: Internal Medicine

## 2023-04-10 DIAGNOSIS — R072 Precordial pain: Secondary | ICD-10-CM

## 2023-04-11 LAB — ECHOCARDIOGRAM COMPLETE
AR max vel: 2.84 cm2
AV Area VTI: 2.86 cm2
AV Area mean vel: 2.83 cm2
AV Mean grad: 3 mmHg
AV Peak grad: 5.8 mmHg
Ao pk vel: 1.2 m/s
Area-P 1/2: 3.17 cm2
Calc EF: 52.5 %
S' Lateral: 3 cm
Single Plane A2C EF: 53 %
Single Plane A4C EF: 52.6 %

## 2023-04-14 ENCOUNTER — Encounter: Payer: Self-pay | Admitting: Cardiology

## 2023-04-14 ENCOUNTER — Ambulatory Visit: Payer: Medicare HMO | Attending: Cardiology | Admitting: Cardiology

## 2023-04-14 VITALS — BP 151/80 | HR 87 | Ht 65.0 in | Wt 132.6 lb

## 2023-04-14 DIAGNOSIS — Z720 Tobacco use: Secondary | ICD-10-CM

## 2023-04-14 DIAGNOSIS — R072 Precordial pain: Secondary | ICD-10-CM

## 2023-04-14 DIAGNOSIS — M79605 Pain in left leg: Secondary | ICD-10-CM

## 2023-04-14 DIAGNOSIS — R0602 Shortness of breath: Secondary | ICD-10-CM | POA: Diagnosis not present

## 2023-04-14 DIAGNOSIS — I10 Essential (primary) hypertension: Secondary | ICD-10-CM | POA: Diagnosis not present

## 2023-04-14 DIAGNOSIS — M79604 Pain in right leg: Secondary | ICD-10-CM | POA: Diagnosis not present

## 2023-04-14 DIAGNOSIS — I739 Peripheral vascular disease, unspecified: Secondary | ICD-10-CM

## 2023-04-14 DIAGNOSIS — E782 Mixed hyperlipidemia: Secondary | ICD-10-CM

## 2023-04-14 NOTE — Progress Notes (Signed)
Cardiology Office Note:   Date:  04/14/2023  ID:  Victorino Sparrow, DOB 1961-10-28, MRN [id number]  History of Present Illness:   Terry Tapia is a 62 y.o. male with a past medical history of hypertension, asthma/COPD, celiac disease, gastroesophageal reflux disease who has been having a 1 to 105-month history of intermittent chest pain, who is here today for follow-up.  He was last seen in clinic 02/27/2023 where he had reported a 1 to 52-month history of intermittent chest pain.  It was located near the left shoulder and it sometimes sharp but more often as a burning sensation and can last up to 3 days at a time.  He does have chronic low back pain that often radiates into his upper back between his shoulder blades.  He has had severe pain in his legs that limits his mobility.  He did note chronic shortness of breath.  He had had several falls on account of his right leg giving out.  He underwent exercise stress testing [facility] in 2016 for evaluation atypical chest pain the study was nondiagnostic due to failure to achieve target heart rate.  Echocardiogram at that time showed mildly reduced LVEF of 50% but no other significant structural abnormalities.  He was scheduled for a repeat echocardiogram as well as a coronary CTA.  He returns to clinic today with several various complaints of shortness of breath that he states is unchanged, his left foot was swollen that has improved, his legs continue to feel weak and give way.  He continues to have a prickly and then advances to a stabbing like burning pain in his chest that he states is coming from his back.  He does complain of claudication symptoms to his bilateral lower extremities.  States that he did have a functional evaluation completed at [facility] and was advised he will no longer be able to return to that type of work that he was doing previously and he is a little upset about that as well.  He states that he is also nervous and a little bit anxious today  about his test results as he previously had an echocardiogram and a coronary CTA completed.  He denies any hospitalizations or visits to the emergency department.  ROS: 10 point review of systems has been reviewed and considered negative with exception of what is been listed in the HPI  Studies Reviewed:    EKG: No new tracings have been completed today  TTE 04/10/23 1. Left ventricular ejection fraction, by estimation, is 55 to 60%. Left  ventricular ejection fraction by 3D volume is 55 %. The left ventricle has  normal function. The left ventricle has no regional wall motion  abnormalities. Left ventricular diastolic   parameters were normal. The average left ventricular global longitudinal  strain is -17.7 %.   2. Right ventricular systolic function is normal. The right ventricular  size is normal. Tricuspid regurgitation signal is inadequate for assessing  PA pressure.   3. The mitral valve is normal in structure. No evidence of mitral valve  regurgitation. No evidence of mitral stenosis.   4. The aortic valve has an indeterminant number of cusps. Aortic valve  regurgitation is not visualized. No aortic stenosis is present.   5. The inferior vena cava is normal in size with greater than 50%  respiratory variability, suggesting right atrial pressure of 3 mmHg.   Coronary CTA 03/26/23 IMPRESSION: 1. Normal coronary calcium score of 0. Patient is low risk.   2.  Normal coronary origin with right dominance.   3. No evidence of CAD.   4. CAD-RADS 0. Consider non-atherosclerotic causes of chest pain.    Risk Assessment/Calculations:     HYPERTENSION CONTROL Vitals:   04/14/23 1447 04/14/23 1502  BP: (!) 150/83 (!) 151/80    The patient's blood pressure is elevated above target today.  In order to address the patient's elevated BP: The blood pressure is usually elevated in clinic.  Blood pressures monitored at home have been optimal.           Physical Exam:   VS:  BP (!)  151/80 (BP Location: Right Arm, Patient Position: Sitting, Cuff Size: Normal)   Pulse 87   Ht 5\' 5"  (1.651 m)   Wt 132 lb 9.6 oz (60.1 kg)   SpO2 98%   BMI 22.07 kg/m    Wt Readings from Last 3 Encounters:  04/14/23 132 lb 9.6 oz (60.1 kg)  02/27/23 131 lb 4 oz (59.5 kg)  05/08/16 125 lb (56.7 kg)     GEN: Well nourished, well developed in no acute distress NECK: No JVD; No carotid bruits CARDIAC: RRR, no murmurs, rubs, gallops RESPIRATORY:  Clear to auscultation without rales, wheezing or rhonchi  ABDOMEN: Soft, non-tender, non-distended EXTREMITIES:  No edema; No deformity   ASSESSMENT AND PLAN:   Atypical chest pain with associated shortness of breath that he believes is coming from his back.  Coronary calcium score of 0 no coronary artery disease noted on his scan and no extracardiac findings.  Echocardiogram revealed LVEF of 55-60%, no regional wall motion abnormalities, home and no valvular abnormalities were noted.  Hypertension with a blood pressure that is rather elevated today.  He states that he is extremely nervous as he was unsure about his test results.  He does have a lot of back pain and leg pain that he complains of today as well.  He is blood pressures have been well-controlled in the 120s systolic.  Will continue to monitor he will likely need pharmacologic management if his pressure continues to remain elevated.  Hyperlipidemia with a last LDL of 129.  His 10-year risk of CVD is 19.0% according to the ASCVD risk estimator.  Recommendations are for moderate intensity statin for patients with an LDL-C of 70-1 89 due to the presence of risk enhancing factors.  Patient is concerned with the problems he is having with discomfort in his legs and chronic back pain of starting statin therapy at this time.    Intermittent claudication symptoms he has been scheduled for ABIs.  He has palpable pulses.  His discomfort is likely coming from the issues with his back but he was  concerned about circulation as well.  Advised of his ABIs resulted as normal and is likely related to his back issues.  Tobacco abuse as he continues to smokeless tobacco.  Total cessation is recommended.  Disposition patient return to clinic to see MD/APP after ABIs have been completed or sooner if needed.        Signed, Taylin Mans, NP

## 2023-04-14 NOTE — Patient Instructions (Signed)
Medication Instructions:   Your physician recommends that you continue on your current medications as directed. Please refer to the Current Medication list given to you today.  *If you need a refill on your cardiac medications before your next appointment, please call your pharmacy*   Lab Work:  None Ordered  If you have labs (blood work) drawn today and your tests are completely normal, you will receive your results only by: MyChart Message (if you have MyChart) OR A paper copy in the mail If you have any lab test that is abnormal or we need to change your treatment, we will call you to review the results.   Testing/Procedures:  Your physician has requested that you have an ankle brachial index (ABI). During this test an ultrasound and blood pressure cuff are used to evaluate the arteries that supply the arms and legs with blood. Allow thirty minutes for this exam. There are no restrictions or special instructions.    Follow-Up: At [facility], you and your health needs are our priority.  As part of our continuing mission to provide you with exceptional heart care, we have created designated Provider Care Teams.  These Care Teams include your primary Cardiologist (physician) and Advanced Practice Providers (APPs -  Physician Assistants and Nurse Practitioners) who all work together to provide you with the care you need, when you need it.  We recommend signing up for the patient portal called "MyChart".  Sign up information is provided on this After Visit Summary.  MyChart is used to connect with patients for Virtual Visits (Telemedicine).  Patients are able to view lab/test results, encounter notes, upcoming appointments, etc.  Non-urgent messages can be sent to your provider as well.   To learn more about what you can do with MyChart, go to [email or web address].    Your next appointment:   3 month(s)  Provider:   You may see Yvonne Kendall, MD or one of the following  Advanced Practice Providers on your designated Care Team:   Nicolasa Ducking, NP Eula Listen, PA-C Cadence Fransico Michael, PA-C Charlsie Quest, NP

## 2023-04-16 ENCOUNTER — Encounter: Payer: Self-pay | Admitting: Internal Medicine

## 2023-04-23 ENCOUNTER — Encounter: Payer: Self-pay | Admitting: Internal Medicine

## 2023-04-29 ENCOUNTER — Encounter: Payer: Self-pay | Admitting: Internal Medicine

## 2023-05-08 ENCOUNTER — Other Ambulatory Visit: Payer: Self-pay

## 2023-05-15 ENCOUNTER — Other Ambulatory Visit: Payer: Self-pay | Admitting: Cardiology

## 2023-05-15 DIAGNOSIS — M79604 Pain in right leg: Secondary | ICD-10-CM

## 2023-06-02 ENCOUNTER — Ambulatory Visit: Payer: Medicare HMO | Attending: Cardiology

## 2023-06-02 DIAGNOSIS — M79604 Pain in right leg: Secondary | ICD-10-CM | POA: Diagnosis not present

## 2023-06-02 DIAGNOSIS — M79605 Pain in left leg: Secondary | ICD-10-CM | POA: Diagnosis not present

## 2023-06-04 LAB — VAS US ABI WITH/WO TBI
Left ABI: 1.12
Right ABI: 1.19

## 2023-06-05 NOTE — Progress Notes (Signed)
Normal findings on exam. Overall reassuring study.

## 2023-06-07 ENCOUNTER — Ambulatory Visit
Admission: EM | Admit: 2023-06-07 | Discharge: 2023-06-07 | Disposition: A | Discharge status: Home / Self Care | Payer: Medicare HMO

## 2023-06-07 DIAGNOSIS — S60561A Insect bite (nonvenomous) of right hand, initial encounter: Secondary | ICD-10-CM

## 2023-06-07 DIAGNOSIS — W57XXXA Bitten or stung by nonvenomous insect and other nonvenomous arthropods, initial encounter: Secondary | ICD-10-CM

## 2023-06-07 MED ORDER — MUPIROCIN 2 % EX OINT: 1.0000 | TOPICAL_OINTMENT | Freq: Two times a day (BID) | CUTANEOUS | 0 refills | Status: AC

## 2023-06-07 NOTE — ED Triage Notes (Signed)
Pt c/o spider bite along right hand on 06/06/23.  Pt is worried about brown recluse bite  Pt was picking blueberries along the bottom of the bush.   Pt states that he did not see it.   Pt states that the bite burns.

## 2023-06-07 NOTE — Discharge Instructions (Addendum)
Use antibiotic ointment as directed. Follow up with PCP. Go to Er for new or worsening issues(streaking,redness, purulent drainage,etc).

## 2023-06-07 NOTE — ED Provider Notes (Signed)
Terry Tapia    CSN: [id number] Arrival date & time: 06/07/23  1404      History   Chief Complaint Chief Complaint  Patient presents with   Insect Bite    HPI Terry Tapia is a 62 y.o. male.   62 year old male pt, Terry Tapia, presents to urgent care for evalaution of bite to right palm, incident occurred 07/20 while picking blueberries, did not see what bit or stung him.   The history is provided by the patient. No language interpreter was used.    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   GERD (gastroesophageal reflux disease)    NORMAL EGD 01/02/15   Glaucoma    Headache    Migraines   Helicobacter pylori gastritis    Hypertension    Liver function study, abnormal 10/23/2014   Phimosis    Thrombocytosis    Vitamin B12 deficiency anemia    Blood transfusion 2012    Patient Active Problem List   Diagnosis Date Noted   Infected insect bite or sting 06/07/2023   Biceps tendinitis on right 05/08/2016   Vitamin B12 deficiency (dietary) anemia 02/07/2016   Essential hypertension 02/07/2016   Prediabetes 05/01/2015   Celiac disease    GERD (gastroesophageal reflux disease)    Vitamin B12 deficiency anemia    Eye cancer (HCC)    Phimosis    Glaucoma    BPH without urinary obstruction    Diverticulosis     Past Surgical History:  Procedure Laterality Date   COLONOSCOPY  01/02/15   Diverticulosis in ascending colon, no polyps   EYE SURGERY Left    Squamous cell cancer reomved       Home Medications    Prior to Admission medications   Medication Sig Start Date End Date Taking? Authorizing Provider  albuterol (PROVENTIL HFA;VENTOLIN HFA) 108 (90 Base) MCG/ACT  inhaler Inhale 2 puffs into the lungs every 6 (six) hours as needed for wheezing or shortness of breath. 02/21/16  Yes McGowan, Carollee Herter A, PA-C  Cyanocobalamin (B-12) 1000 MCG TABS Take 1,000 mcg by mouth daily.   Yes [provider]  cyclobenzaprine (FLEXERIL) 10 MG tablet Take 10 mg by mouth 2 (two) times daily.   Yes [provider]  dicyclomine (BENTYL) 20 MG tablet Take 1/2 TABLET (10MG ) by mouth 4 times daily as needed for spasms/abdominal pain. 10/16/16  Yes McGowan, Shannon A, PA-C  ferrous sulfate 324 MG TBEC Take 65 mg by mouth 2 (two) times daily.   Yes [provider]  fluticasone (FLONASE) 50 MCG/ACT nasal spray Place 2 sprays into both nostrils daily. 11/19/16  Yes McGowan, Carollee Herter A, PA-C  loratadine (CLARITIN) 10 MG tablet Take 1 tablet (10 mg total) by mouth daily. 02/21/16  Yes McGowan, Carollee Herter A, PA-C  montelukast (SINGULAIR) 10 MG tablet Take 1 tablet (10 mg total) by mouth at bedtime. 02/21/16  Yes McGowan, Carollee Herter A, PA-C  mupirocin ointment (BACTROBAN) 2 % Apply 1 Application topically 2 (two) times daily for 7 days. 06/07/23 06/14/23 Yes Imari Sivertsen, Para March, NP  pantoprazole (PROTONIX) 40 MG tablet Take 40 mg by mouth daily.   Yes [provider]  SYMBICORT 160-4.5 MCG/ACT inhaler INHALE 2 PUFFS 2 TIMES A DAY. 03/11/16  Yes McGowan, Carollee Herter A, PA-C  tamsulosin (FLOMAX) 0.4 MG CAPS capsule Take 1 capsule (0.4 mg total) by mouth 2 (two) times daily. 02/07/16  Yes McGowan, Shannon A, PA-C  IYUZEH 0.005 % SOLN Apply 1 drop to eye at bedtime.    [provider]    Family History Family History  Problem Relation Age of Onset   Hypertension Mother    Lung disease Mother    GI Disease Mother    Diabetes Father    Hypertension Father    Thyroid disease Father    Stroke Father    Peripheral Artery Disease Father     Social History Social History   Tobacco Use   Smoking status: Never   Smokeless tobacco: Current    Types: Chew   Tobacco  comments:    Intermittent chewing tobacco use  Vaping Use   Vaping status: Never Used  Substance Use Topics   Alcohol use: No   Drug use: No     Allergies   Penicillins   Review of Systems Review of Systems  Constitutional:  Negative for fever.  Skin:  Positive for color change and wound.  All other systems reviewed and are negative.    Physical Exam Triage Vital Signs ED Triage Vitals  Encounter Vitals Group     BP 06/07/23 1504 117/77     Systolic BP Percentile --      Diastolic BP Percentile --      Pulse Rate 06/07/23 1504 80     Resp 06/07/23 1504 16     Temp 06/07/23 1504 97.6 F (36.4 C)     Temp Source 06/07/23 1504 Temporal     SpO2 06/07/23 1504 93 %     Weight 06/07/23 1505 125 lb (56.7 kg)     Height 06/07/23 1505 5\' 5"  (1.651 m)     Head Circumference --      Peak Flow --      Pain Score 06/07/23 1505 1     Pain Loc --      Pain Education --      Exclude from Growth Chart --    No data found.  Updated Vital Signs BP 117/77 (BP Location: Left Arm)   Pulse 80   Temp 97.6 F (36.4 C) (Temporal)   Resp 16   Ht 5\' 5"  (1.651 m)   Wt 125 lb (56.7 kg)   SpO2 93%   BMI 20.80 kg/m   Visual Acuity Right Eye Distance:   Left Eye Distance:   Bilateral Distance:    Right Eye Near:   Left Eye Near:    Bilateral Near:     Physical Exam Vitals and nursing note reviewed.  Skin:    Findings: Signs of injury and wound present.     Comments: Right lateral palm dime size brownish area with pale center, no drainage,no erythema, no streaking,no fluctuance. Pt has full ROM.  Neurological:     General: No focal deficit present.     Mental Status: He is alert and oriented to person, place, and time.     GCS: GCS eye subscore is 4. GCS verbal subscore is 5. GCS motor subscore is 6.  Psychiatric:        Attention and Perception: Attention normal.        Mood and Affect: Mood normal.        Speech: Speech normal.  Behavior: Behavior normal.       UC Treatments / Results  Labs (all labs ordered are listed, but only abnormal results are displayed) Labs Reviewed - No data to display  EKG   Radiology No results found.  Procedures Procedures (including critical care time)  Medications Ordered in UC Medications - No data to display  Initial Impression / Assessment and Plan / UC Course  I have reviewed the triage vital signs and the nursing notes.  Pertinent labs & imaging results that were available during my care of the patient were reviewed by me and considered in my medical decision making (see chart for details).     Ddx: insect bite,sting, allergy Final Clinical Impressions(s) / UC Diagnoses   Final diagnoses:  Infected insect bite or sting     Discharge Instructions      Use antibiotic ointment as directed. Follow up with PCP. Go to Er for new or worsening issues(streaking,redness, purulent drainage,etc).      ED Prescriptions     Medication Sig Dispense Auth. Provider   mupirocin ointment (BACTROBAN) 2 % Apply 1 Application topically 2 (two) times daily for 7 days. 14 g Otie Headlee, Para March, NP      PDMP not reviewed this encounter.   Clancy Gourd, NP 06/07/23 1700

## 2023-07-22 ENCOUNTER — Encounter: Payer: Self-pay | Admitting: Cardiology

## 2023-07-22 ENCOUNTER — Ambulatory Visit: Payer: Medicare HMO | Attending: Cardiology | Admitting: Cardiology

## 2023-07-22 VITALS — BP 122/76 | HR 68 | Ht 65.0 in | Wt 136.4 lb

## 2023-07-22 DIAGNOSIS — I10 Essential (primary) hypertension: Secondary | ICD-10-CM | POA: Diagnosis not present

## 2023-07-22 DIAGNOSIS — Z720 Tobacco use: Secondary | ICD-10-CM | POA: Diagnosis not present

## 2023-07-22 DIAGNOSIS — E782 Mixed hyperlipidemia: Secondary | ICD-10-CM

## 2023-07-22 DIAGNOSIS — M549 Dorsalgia, unspecified: Secondary | ICD-10-CM

## 2023-07-22 DIAGNOSIS — I739 Peripheral vascular disease, unspecified: Secondary | ICD-10-CM

## 2023-07-22 DIAGNOSIS — G8929 Other chronic pain: Secondary | ICD-10-CM

## 2023-07-22 DIAGNOSIS — R0789 Other chest pain: Secondary | ICD-10-CM

## 2023-07-22 MED ORDER — ROSUVASTATIN CALCIUM 5 MG PO TABS: 5.0000 mg | ORAL_TABLET | ORAL | 3 refills | Status: DC

## 2023-07-22 NOTE — Progress Notes (Signed)
Cardiology Office Note:  .   Date:  07/22/2023  ID:  Victorino Sparrow, DOB 07-05-1961, MRN [id number] PCP: Dudley Major, FNP  [facility] Providers Cardiologist:  Yvonne Kendall, MD    History of Present Illness: .   Terry Tapia is a 62 y.o. male with a past medical history of hypertension, asthma/COPD, celiac disease, gastroesophageal reflux disease, intermittent chest pain, who is here today for follow-up.  He underwent exercise stress testing [facility] in 2016 for evaluation of atypical chest pain the study was nondiagnostic due to failure to achieve target heart rate.  Echocardiogram at that time showed mildly reduced LVEF 50% and no other significant structural abnormalities.  He was scheduled for repeat echocardiogram as well as a coronary CTA.  Repeat echocardiogram revealed LVEF 55 to 60%, no RWMA, and no valvular abnormalities.  Coronary CTA completed revealed a coronary calcium score of 0 and was recommended to consider nonatherosclerotic causes of chest pain.  He was last seen in clinic 04/14/2023 for intermittent chest pain that been going on for the past 1 to 2 months.  He also complained of claudication to his bilateral lower extremities and stated he had had a functional evaluation completed at [facility] and was advised he would no longer be able to return to the type of work he had been doing previously.  No medication changes were made and he was scheduled for ABI's for his complaint of claudication.  He returns to clinic today stating that overall he has been doing okay.  He is scheduled for an upper endoscopy that is upcoming.  He continues to have chronic back pain and leg pain related to his back pain.  States that he had 1 episode of chest burning that was short-lived with no alleviating or aggravating factors that he thought may be related to diet.  Denies any shortness of breath, palpitations, or peripheral edema.  Denies any recent hospitalizations or visits to the emergency  department  ROS: 10 point review of systems has been reviewed and considered negative with exception of what is been listed in the HPI  Studies Reviewed: Marland Kitchen       ABI's 06/02/23 Summary:  Right: Resting right ankle-brachial index is within normal range. The  right toe-brachial index is normal.   Left: Resting left ankle-brachial index is within normal range. The left  toe-brachial index is normal.   TTE 04/10/23 1. Left ventricular ejection fraction, by estimation, is 55 to 60%. Left  ventricular ejection fraction by 3D volume is 55 %. The left ventricle has  normal function. The left ventricle has no regional wall motion  abnormalities. Left ventricular diastolic   parameters were normal. The average left ventricular global longitudinal  strain is -17.7 %.   2. Right ventricular systolic function is normal. The right ventricular  size is normal. Tricuspid regurgitation signal is inadequate for assessing  PA pressure.   3. The mitral valve is normal in structure. No evidence of mitral valve  regurgitation. No evidence of mitral stenosis.   4. The aortic valve has an indeterminant number of cusps. Aortic valve  regurgitation is not visualized. No aortic stenosis is present.   5. The inferior vena cava is normal in size with greater than 50%  respiratory variability, suggesting right atrial pressure of 3 mmHg.    Coronary CTA 03/26/23 IMPRESSION: 1. Normal coronary calcium score of 0. Patient is low risk.   2. Normal coronary origin with right dominance.   3. No  evidence of CAD.   4. CAD-RADS 0. Consider non-atherosclerotic causes of chest pain. Risk Assessment/Calculations:             Physical Exam:   VS:  BP 122/76 (BP Location: Right Arm, Patient Position: Sitting, Cuff Size: Normal)   Pulse 68   Ht 5\' 5"  (1.651 m)   Wt 136 lb 6.4 oz (61.9 kg)   SpO2 96%   BMI 22.70 kg/m    Wt Readings from Last 3 Encounters:  07/22/23 136 lb 6.4 oz (61.9 kg)  06/07/23 125 lb (56.7  kg)  04/14/23 132 lb 9.6 oz (60.1 kg)    GEN: Well nourished, well developed in no acute distress NECK: No JVD; No carotid bruits CARDIAC: RRR, no murmurs, rubs, gallops RESPIRATORY:  Clear to auscultation without rales, wheezing or rhonchi  ABDOMEN: Soft, non-tender, non-distended EXTREMITIES:  No edema; No deformity   ASSESSMENT AND PLAN: .   Atypical chest pain that he relates to diet or chronic back pain.  Coronary calcium score of 0.  Echocardiogram with LVEF 55 to 60%, no regional wall motion abnormalities and no valvular abnormalities were noted.  Mixed hyperlipidemia with LDL of 127.  10-year risk of CVD is 19%.  ASCVD risk estimator recommends moderate intensity statin for patients with LDL-C of 70-189 with presence of risk enhancing factors.  Patient is not agreeable to starting rosuvastatin 5 mg 3 days a week and continue with lifestyle and dietary modifications.  Plan for repeat lipid and hepatic panel in 10 to 12 weeks.  Hypertension with blood pressure today 122/76.  Blood pressure remained stable today.  Patient states that his pain is fairly well under control today.  He has been encouraged to continue to monitor his pressures at home as he currently is not on any pharmacologic management.  Intermittent claudication with ABI's that were abnormal.  He continues to have palpable pulses to bilateral lower extremities.  Encouraged to continue with exercise regimen and to continue with his range of motion exercises for his chronic back pain.  Chronic back pain which has been evaluated by surgery and is not deemed a surgical candidate due to the risk versus benefits of his surgery.  He is continued with the medication and range of motion therapy.  Tobacco abuse.  Continues to use smokeless tobacco return of cessation continues to be recommended.       Dispo: Patient return to clinic to see MD/APP in 6 months or sooner if needed  Signed, Georgie Eduardo, NP

## 2023-07-22 NOTE — Patient Instructions (Addendum)
Medication Instructions:  Your physician recommends the following medication changes.   START TAKING: Rosuvastatin (Crestor) 5 mg three times weekly on Monday, Wednesday, Friday    *If you need a refill on your cardiac medications before your next appointment, please call your pharmacy*   Lab Work: Your provider would like for you to return in 3 months to have the following labs drawn: Lipid and hepatic panel.   Please go to [facility] [address] ([facility] [address], [place] [address] You do not need an appointment.  They are open from 7:30 am-4 pm.  Lunch from 1:00 pm- 2:00 pm You need to be fasting.   You may also go to any of these [facility] locations:  [employer]  - [address] [employer] [address] Chief Technology Officer)     If you have labs (blood work) drawn today and your tests are completely normal, you will receive your results only by: [employer] (if you have MyChart) OR A paper copy in the mail If you have any lab test that is abnormal or we need to change your treatment, we will call you to review the results.   Testing/Procedures: none   Follow-Up: At [facility], you and your health needs are our priority.  As part of our continuing mission to provide you with exceptional heart care, we have created designated Provider Care Teams.  These Care Teams include your primary Cardiologist (physician) and Advanced Practice Providers (APPs -  Physician Assistants and Nurse Practitioners) who all work together to provide you with the care you need, when you need it.  We recommend signing up for the patient portal called "MyChart".  Sign up information is provided on this After Visit Summary.  MyChart is used to connect with patients for Virtual Visits (Telemedicine).  Patients are able to view lab/test results, encounter notes, upcoming appointments, etc.  Non-urgent messages can be sent to your provider as well.   To learn more  about what you can do with MyChart, go to [email or web address].    Your next appointment:   6 month(s)  Provider:   Yvonne Kendall, MD or Charlsie Quest, NP

## 2023-07-23 NOTE — Telephone Encounter (Signed)
Unfortunately taking CoQ10 and red yeast rice will not help from the same benefit as taking rosuvastatin or Crestor.  Red yeast rice is a lesser equivalent to a low-dose simvastatin.  If he is worried about side effects from the rosuvastatin he is more than welcome to take co-Q10 with his statin medication.

## 2023-07-24 ENCOUNTER — Other Ambulatory Visit
Admission: RE | Admit: 2023-07-24 | Discharge: 2023-07-24 | Disposition: A | Discharge status: Home / Self Care | Payer: Medicare HMO | Attending: Cardiology | Admitting: *Deleted

## 2023-07-24 DIAGNOSIS — E782 Mixed hyperlipidemia: Secondary | ICD-10-CM | POA: Insufficient documentation

## 2023-07-24 LAB — HEPATIC FUNCTION PANEL
ALT: 19 U/L (ref 0–44)
AST: 21 U/L (ref 15–41)
Albumin: 3.8 g/dL (ref 3.5–5.0)
Alkaline Phosphatase: 87 U/L (ref 38–126)
Bilirubin, Direct: <0.1 mg/dL (ref 0.0–0.2)
Total Bilirubin: 0.5 mg/dL (ref 0.3–1.2)
Total Protein: 6.9 g/dL (ref 6.5–8.1)

## 2023-07-24 LAB — LIPID PANEL
Cholesterol: 160 mg/dL (ref 0–200)
HDL: 33 mg/dL — ABNORMAL LOW (ref >40)
LDL Cholesterol: 114 mg/dL — ABNORMAL HIGH (ref 0–99)
Total CHOL/HDL Ratio: 4.8 ratio
Triglycerides: 64 mg/dL (ref <150)
VLDL: 13 mg/dL (ref 0–40)

## 2023-07-24 NOTE — Progress Notes (Signed)
LDL of 114 with a goal of less then 100.  He would benefit from starting the rosuvastatin to get him to goal.  If he decides to take the rosuvastatin versus the red yeast rice he would need a repeat lipid and hepatic panel in 10 to 12 weeks.

## 2023-07-27 NOTE — Group Note (Deleted)

## 2023-08-14 ENCOUNTER — Ambulatory Visit: Payer: Medicare HMO

## 2023-08-14 DIAGNOSIS — D509 Iron deficiency anemia, unspecified: Secondary | ICD-10-CM | POA: Diagnosis not present

## 2023-08-14 DIAGNOSIS — K900 Celiac disease: Secondary | ICD-10-CM | POA: Diagnosis not present

## 2023-12-18 DIAGNOSIS — D75839 Thrombocytosis, unspecified: Secondary | ICD-10-CM | POA: Diagnosis not present

## 2023-12-18 DIAGNOSIS — E7800 Pure hypercholesterolemia, unspecified: Secondary | ICD-10-CM | POA: Diagnosis not present

## 2023-12-18 DIAGNOSIS — R3912 Poor urinary stream: Secondary | ICD-10-CM | POA: Diagnosis not present

## 2023-12-18 DIAGNOSIS — J4520 Mild intermittent asthma, uncomplicated: Secondary | ICD-10-CM | POA: Diagnosis not present

## 2023-12-18 DIAGNOSIS — E538 Deficiency of other specified B group vitamins: Secondary | ICD-10-CM | POA: Diagnosis not present

## 2023-12-18 DIAGNOSIS — D508 Other iron deficiency anemias: Secondary | ICD-10-CM | POA: Diagnosis not present

## 2023-12-18 DIAGNOSIS — K219 Gastro-esophageal reflux disease without esophagitis: Secondary | ICD-10-CM | POA: Diagnosis not present

## 2023-12-18 DIAGNOSIS — N401 Enlarged prostate with lower urinary tract symptoms: Secondary | ICD-10-CM | POA: Diagnosis not present

## 2023-12-18 DIAGNOSIS — R7303 Prediabetes: Secondary | ICD-10-CM | POA: Diagnosis not present

## 2024-01-01 DIAGNOSIS — D75839 Thrombocytosis, unspecified: Secondary | ICD-10-CM | POA: Diagnosis not present

## 2024-01-01 DIAGNOSIS — R7303 Prediabetes: Secondary | ICD-10-CM | POA: Diagnosis not present

## 2024-01-01 DIAGNOSIS — D508 Other iron deficiency anemias: Secondary | ICD-10-CM | POA: Diagnosis not present

## 2024-01-01 DIAGNOSIS — E538 Deficiency of other specified B group vitamins: Secondary | ICD-10-CM | POA: Diagnosis not present

## 2024-01-01 DIAGNOSIS — E7800 Pure hypercholesterolemia, unspecified: Secondary | ICD-10-CM | POA: Diagnosis not present

## 2024-01-27 DIAGNOSIS — L718 Other rosacea: Secondary | ICD-10-CM | POA: Diagnosis not present

## 2024-01-27 DIAGNOSIS — H2513 Age-related nuclear cataract, bilateral: Secondary | ICD-10-CM | POA: Diagnosis not present

## 2024-01-27 DIAGNOSIS — H401112 Primary open-angle glaucoma, right eye, moderate stage: Secondary | ICD-10-CM | POA: Diagnosis not present

## 2024-01-27 DIAGNOSIS — H35033 Hypertensive retinopathy, bilateral: Secondary | ICD-10-CM | POA: Diagnosis not present

## 2024-01-27 DIAGNOSIS — H524 Presbyopia: Secondary | ICD-10-CM | POA: Diagnosis not present

## 2024-01-27 DIAGNOSIS — H0288A Meibomian gland dysfunction right eye, upper and lower eyelids: Secondary | ICD-10-CM | POA: Diagnosis not present

## 2024-01-27 DIAGNOSIS — E119 Type 2 diabetes mellitus without complications: Secondary | ICD-10-CM | POA: Diagnosis not present

## 2024-01-27 DIAGNOSIS — H16223 Keratoconjunctivitis sicca, not specified as Sjogren's, bilateral: Secondary | ICD-10-CM | POA: Diagnosis not present

## 2024-01-27 DIAGNOSIS — H401121 Primary open-angle glaucoma, left eye, mild stage: Secondary | ICD-10-CM | POA: Diagnosis not present

## 2024-01-27 DIAGNOSIS — C6902 Malignant neoplasm of left conjunctiva: Secondary | ICD-10-CM | POA: Diagnosis not present

## 2024-01-27 DIAGNOSIS — I10 Essential (primary) hypertension: Secondary | ICD-10-CM | POA: Diagnosis not present

## 2024-01-27 DIAGNOSIS — H0288B Meibomian gland dysfunction left eye, upper and lower eyelids: Secondary | ICD-10-CM | POA: Diagnosis not present

## 2024-02-23 DIAGNOSIS — E559 Vitamin D deficiency, unspecified: Secondary | ICD-10-CM | POA: Diagnosis not present

## 2024-02-23 DIAGNOSIS — E538 Deficiency of other specified B group vitamins: Secondary | ICD-10-CM | POA: Diagnosis not present

## 2024-02-23 DIAGNOSIS — K900 Celiac disease: Secondary | ICD-10-CM | POA: Diagnosis not present

## 2024-02-23 DIAGNOSIS — D500 Iron deficiency anemia secondary to blood loss (chronic): Secondary | ICD-10-CM | POA: Diagnosis not present

## 2024-02-23 DIAGNOSIS — K219 Gastro-esophageal reflux disease without esophagitis: Secondary | ICD-10-CM | POA: Diagnosis not present

## 2024-03-25 DIAGNOSIS — M8588 Other specified disorders of bone density and structure, other site: Secondary | ICD-10-CM | POA: Diagnosis not present

## 2024-04-22 ENCOUNTER — Encounter: Payer: Self-pay | Admitting: Cardiology

## 2024-04-22 ENCOUNTER — Ambulatory Visit: Attending: Cardiology | Admitting: Cardiology

## 2024-04-22 VITALS — BP 120/60 | HR 64 | Ht 65.0 in | Wt 139.5 lb

## 2024-04-22 DIAGNOSIS — R0789 Other chest pain: Secondary | ICD-10-CM

## 2024-04-22 DIAGNOSIS — Z720 Tobacco use: Secondary | ICD-10-CM | POA: Diagnosis not present

## 2024-04-22 DIAGNOSIS — G8929 Other chronic pain: Secondary | ICD-10-CM | POA: Diagnosis not present

## 2024-04-22 DIAGNOSIS — I10 Essential (primary) hypertension: Secondary | ICD-10-CM

## 2024-04-22 DIAGNOSIS — M549 Dorsalgia, unspecified: Secondary | ICD-10-CM | POA: Diagnosis not present

## 2024-04-22 DIAGNOSIS — E782 Mixed hyperlipidemia: Secondary | ICD-10-CM

## 2024-04-22 DIAGNOSIS — I739 Peripheral vascular disease, unspecified: Secondary | ICD-10-CM | POA: Diagnosis not present

## 2024-04-22 NOTE — Progress Notes (Signed)
 Cardiology Office Note   Date:  04/22/2024  ID:  YASSER HEPP, DOB 1961-08-08, MRN [id number] PCP: Monique Ano, MD   [facility] Providers Cardiologist:  Sammy Crisp, MD     History of Present Illness Terry Tapia is a 63 y.o. male with a past medical history of hypertension, asthma/COPD, celiac disease, GERD, intermittent chest pain, is here today for follow-up.   He underwent exercise stress testing [facility] in 2016 for evaluation of atypical chest pain the study was nondiagnostic due to failure to achieve target heart rate. Echocardiogram at that time showed mildly reduced LVEF 50% and no other significant structural abnormalities. He was scheduled for repeat echocardiogram as well as a coronary CTA. Repeat echocardiogram revealed LVEF 55 to 60%, no RWMA, and no valvular abnormalities. Coronary CTA completed revealed a coronary calcium  score of 0 and was recommended to consider nonatherosclerotic causes of chest pain.   Seen in clinic 04/06/2023 for intermittent chest pain that been going for 1 to 2 months.  He also complained of claudication to his bilateral lower extremities and stated he had had a functional evaluation completed 2 he was advised he would no longer be able to return to any type of work that he been doing previously.  No medication changes were made and he was scheduled for ABIs for his complaint of intermittent claudication.   He was last seen in clinic 07/22/23.  At that time he had been doing okay.  He was scheduled for an upper endoscopy.  Continued to have chronic back pain and leg pain related to his back pain.  71 episode of chest burning that was very short-lived with no alleviating or aggravating factors.  Previous studies were reviewed he had a coronary calcium  score of 0 and a normal echocardiogram.  He was to have repeat labs in 10 to 12 weeks.   He returns to clinic today stating that he has been doing well from a cardiac perspective.  He denies any  chest pain, shortness of breath, dyspnea on exertion.  Continues to have leg pain related to chronic back pain left sciatica.  Recently been started on rosuvastatin  5 mg 3 times a week.  States that he has been compliant with his current medication regimen with any adverse side effects.  States that he is currently changing PCPs.  Denies any hospitalizations or visits to the emergency department.  ROS: 10 point review of systems has been reviewed and considered negative except ones are listed in the HPI  Studies Reviewed EKG Interpretation Date/Time:  Friday April 22 2024 09:10:18 EDT Ventricular Rate:  64 PR Interval:  158 QRS Duration:  88 QT Interval:  414 QTC Calculation: 427 R Axis:   -12  Text Interpretation: Normal sinus rhythm Nonspecific T wave abnormality When compared with ECG of 21-Aug-2014 19:01, Nonspecific T wave abnormality now evident in Inferior leads Nonspecific T wave abnormality, worse in Anterolateral leads Confirmed by Ronald Cockayne ([id number]) on 04/22/2024 9:12:44 AM    ABI's 06/02/23 Summary:  Right: Resting right ankle-brachial index is within normal range. The  right toe-brachial index is normal.   Left: Resting left ankle-brachial index is within normal range. The left  toe-brachial index is normal.    TTE 04/10/23 1. Left ventricular ejection fraction, by estimation, is 55 to 60%. Left  ventricular ejection fraction by 3D volume is 55 %. The left ventricle has  normal function. The left ventricle has no regional wall motion  abnormalities. Left ventricular diastolic  parameters were normal. The average left ventricular global longitudinal  strain is -17.7 %.   2. Right ventricular systolic function is normal. The right ventricular  size is normal. Tricuspid regurgitation signal is inadequate for assessing  PA pressure.   3. The mitral valve is normal in structure. No evidence of mitral valve  regurgitation. No evidence of mitral stenosis.   4. The aortic  valve has an indeterminant number of cusps. Aortic valve  regurgitation is not visualized. No aortic stenosis is present.   5. The inferior vena cava is normal in size with greater than 50%  respiratory variability, suggesting right atrial pressure of 3 mmHg.    Coronary CTA 03/26/23 IMPRESSION: 1. Normal coronary calcium  score of 0. Patient is low risk.   2. Normal coronary origin with right dominance.   3. No evidence of CAD.   4. CAD-RADS 0. Consider non-atherosclerotic causes of chest pain. Risk Assessment/Calculations         Physical Exam VS:  BP 120/60 (BP Location: Left Arm, Patient Position: Sitting, Cuff Size: Normal)   Pulse 64   Ht 5\' 5"  (1.651 m)   Wt 139 lb 8 oz (63.3 kg)   SpO2 97%   BMI 23.21 kg/m    Wt Readings from Last 3 Encounters:  04/22/24 139 lb 8 oz (63.3 kg)  07/22/23 136 lb 6.4 oz (61.9 kg)  06/07/23 125 lb (56.7 kg)    GEN: Well nourished, well developed in no acute distress NECK: No JVD; No carotid bruits CARDIAC: RRR, no murmurs, rubs, gallops RESPIRATORY:  Clear to auscultation without rales, wheezing or rhonchi  ABDOMEN: Soft, non-tender, non-distended EXTREMITIES:  No edema; No deformity   ASSESSMENT AND PLAN Atypical chest pain that he states he has not had any further episodes of it.  Previous review of testing revealed coronary calcium  score of 0.  Echocardiogram revealed LVEF 55 to 60%, no regional wall motion abnormalities, no valvular abnormalities.  EKG today reveals sinus rhythm with rate of 62 with nonspecific T waves primarily noted in inferior and anterior lateral leads.  No acute changes noted.  No further ischemic testing warranted.  Mixed hyperlipidemia with last LDL of 102 01/01/2024.  Improvement from LDL of 127.  Has been continued on rosuvastatin  5 mg 3 days a week has also been encouraged to continue his dietary and lifestyle modifications.  Upcoming labs with his PCP previously been scheduled.  He is also taking red yeast rice,  krill oil, and co-Q10.  Primary hypertension with a blood pressure today 120/60.  Blood pressure continues to remain stable.  Blood pressure continues to be maintained without antihypertensive medications.  Intermittent claudication with ABIs previously completed and was slightly abnormal.  He continues to have palpable pulses to his bilateral lower extremities with no ulcerations.  He is encouraged to continue with the exercise regimen and continue with his range of motion exercises for his chronic back pain.  Chronic back pain reviewed deviously been evaluated by surgery and is not deemed a surgical candidate due to risk versus benefit of his surgery.  He has continued on his current supplements and range of motion therapy.  Tobacco abuse with ration of smokeless tobacco.  Total cessation continues to be recommended.       Dispo: Patient return to clinic to see MD/APP in 6 months or sooner if needed  Signed, Judieth Mckown, NP

## 2024-04-22 NOTE — Patient Instructions (Signed)
 Medication Instructions:  Your physician recommends that you continue on your current medications as directed. Please refer to the Current Medication list given to you today.   *If you need a refill on your cardiac medications before your next appointment, please call your pharmacy*  Lab Work: No labs ordered today  If you have labs (blood work) drawn today and your tests are completely normal, you will receive your results only by: MyChart Message (if you have MyChart) OR A paper copy in the mail If you have any lab test that is abnormal or we need to change your treatment, we will call you to review the results.  Testing/Procedures: No test ordered today   Follow-Up: At [facility], you and your health needs are our priority.  As part of our continuing mission to provide you with exceptional heart care, our providers are all part of one team.  This team includes your primary Cardiologist (physician) and Advanced Practice Providers or APPs (Physician Assistants and Nurse Practitioners) who all work together to provide you with the care you need, when you need it.  Your next appointment:   6 month(s)  Provider:   Sammy Crisp, MD or Ronald Cockayne, NP

## 2024-05-05 DIAGNOSIS — M4802 Spinal stenosis, cervical region: Secondary | ICD-10-CM | POA: Diagnosis not present

## 2024-05-05 DIAGNOSIS — M51369 Other intervertebral disc degeneration, lumbar region without mention of lumbar back pain or lower extremity pain: Secondary | ICD-10-CM | POA: Diagnosis not present

## 2024-05-05 DIAGNOSIS — M5126 Other intervertebral disc displacement, lumbar region: Secondary | ICD-10-CM | POA: Diagnosis not present

## 2024-05-05 DIAGNOSIS — M7918 Myalgia, other site: Secondary | ICD-10-CM | POA: Diagnosis not present

## 2024-05-05 DIAGNOSIS — M48062 Spinal stenosis, lumbar region with neurogenic claudication: Secondary | ICD-10-CM | POA: Diagnosis not present

## 2024-05-05 DIAGNOSIS — M5416 Radiculopathy, lumbar region: Secondary | ICD-10-CM | POA: Diagnosis not present

## 2024-05-24 DIAGNOSIS — L718 Other rosacea: Secondary | ICD-10-CM | POA: Diagnosis not present

## 2024-05-24 DIAGNOSIS — H401112 Primary open-angle glaucoma, right eye, moderate stage: Secondary | ICD-10-CM | POA: Diagnosis not present

## 2024-05-24 DIAGNOSIS — H16223 Keratoconjunctivitis sicca, not specified as Sjogren's, bilateral: Secondary | ICD-10-CM | POA: Diagnosis not present

## 2024-05-24 DIAGNOSIS — H0288B Meibomian gland dysfunction left eye, upper and lower eyelids: Secondary | ICD-10-CM | POA: Diagnosis not present

## 2024-05-24 DIAGNOSIS — H0288A Meibomian gland dysfunction right eye, upper and lower eyelids: Secondary | ICD-10-CM | POA: Diagnosis not present

## 2024-05-24 DIAGNOSIS — H401121 Primary open-angle glaucoma, left eye, mild stage: Secondary | ICD-10-CM | POA: Diagnosis not present

## 2024-06-17 DIAGNOSIS — E538 Deficiency of other specified B group vitamins: Secondary | ICD-10-CM | POA: Diagnosis not present

## 2024-06-17 DIAGNOSIS — D500 Iron deficiency anemia secondary to blood loss (chronic): Secondary | ICD-10-CM | POA: Diagnosis not present

## 2024-06-17 DIAGNOSIS — J4520 Mild intermittent asthma, uncomplicated: Secondary | ICD-10-CM | POA: Diagnosis not present

## 2024-06-17 DIAGNOSIS — R7303 Prediabetes: Secondary | ICD-10-CM | POA: Diagnosis not present

## 2024-06-17 DIAGNOSIS — Z125 Encounter for screening for malignant neoplasm of prostate: Secondary | ICD-10-CM | POA: Diagnosis not present

## 2024-06-17 DIAGNOSIS — K900 Celiac disease: Secondary | ICD-10-CM | POA: Diagnosis not present

## 2024-06-17 DIAGNOSIS — D508 Other iron deficiency anemias: Secondary | ICD-10-CM | POA: Diagnosis not present

## 2024-06-17 DIAGNOSIS — E559 Vitamin D deficiency, unspecified: Secondary | ICD-10-CM | POA: Diagnosis not present

## 2024-06-17 DIAGNOSIS — E7800 Pure hypercholesterolemia, unspecified: Secondary | ICD-10-CM | POA: Diagnosis not present

## 2024-07-01 DIAGNOSIS — R7303 Prediabetes: Secondary | ICD-10-CM | POA: Diagnosis not present

## 2024-07-01 DIAGNOSIS — D508 Other iron deficiency anemias: Secondary | ICD-10-CM | POA: Diagnosis not present

## 2024-07-01 DIAGNOSIS — Z1331 Encounter for screening for depression: Secondary | ICD-10-CM | POA: Diagnosis not present

## 2024-07-01 DIAGNOSIS — E7800 Pure hypercholesterolemia, unspecified: Secondary | ICD-10-CM | POA: Diagnosis not present

## 2024-07-01 DIAGNOSIS — Z0000 Encounter for general adult medical examination without abnormal findings: Secondary | ICD-10-CM | POA: Diagnosis not present

## 2024-08-04 DIAGNOSIS — M7918 Myalgia, other site: Secondary | ICD-10-CM | POA: Diagnosis not present

## 2024-08-04 DIAGNOSIS — M4802 Spinal stenosis, cervical region: Secondary | ICD-10-CM | POA: Diagnosis not present

## 2024-08-04 DIAGNOSIS — M51369 Other intervertebral disc degeneration, lumbar region without mention of lumbar back pain or lower extremity pain: Secondary | ICD-10-CM | POA: Diagnosis not present

## 2024-08-04 DIAGNOSIS — M5126 Other intervertebral disc displacement, lumbar region: Secondary | ICD-10-CM | POA: Diagnosis not present

## 2024-08-04 DIAGNOSIS — M48062 Spinal stenosis, lumbar region with neurogenic claudication: Secondary | ICD-10-CM | POA: Diagnosis not present

## 2024-08-04 DIAGNOSIS — M5416 Radiculopathy, lumbar region: Secondary | ICD-10-CM | POA: Diagnosis not present

## 2024-09-28 DIAGNOSIS — H16223 Keratoconjunctivitis sicca, not specified as Sjogren's, bilateral: Secondary | ICD-10-CM | POA: Diagnosis not present

## 2024-09-28 DIAGNOSIS — H0288B Meibomian gland dysfunction left eye, upper and lower eyelids: Secondary | ICD-10-CM | POA: Diagnosis not present

## 2024-09-28 DIAGNOSIS — L718 Other rosacea: Secondary | ICD-10-CM | POA: Diagnosis not present

## 2024-09-28 DIAGNOSIS — H401112 Primary open-angle glaucoma, right eye, moderate stage: Secondary | ICD-10-CM | POA: Diagnosis not present

## 2024-09-28 DIAGNOSIS — H0288A Meibomian gland dysfunction right eye, upper and lower eyelids: Secondary | ICD-10-CM | POA: Diagnosis not present

## 2024-09-28 DIAGNOSIS — H401121 Primary open-angle glaucoma, left eye, mild stage: Secondary | ICD-10-CM | POA: Diagnosis not present

## 2024-11-02 NOTE — Telephone Encounter (Signed)
Called and LVM to schedule appointment.

## 2024-11-07 ENCOUNTER — Ambulatory Visit: Attending: Cardiology | Admitting: Cardiology

## 2024-11-07 ENCOUNTER — Encounter: Payer: Self-pay | Admitting: Cardiology

## 2024-11-07 VITALS — BP 130/76 | HR 83 | Ht 65.0 in | Wt 135.4 lb

## 2024-11-07 DIAGNOSIS — I10 Essential (primary) hypertension: Secondary | ICD-10-CM

## 2024-11-07 DIAGNOSIS — I739 Peripheral vascular disease, unspecified: Secondary | ICD-10-CM

## 2024-11-07 DIAGNOSIS — E782 Mixed hyperlipidemia: Secondary | ICD-10-CM

## 2024-11-07 DIAGNOSIS — M549 Dorsalgia, unspecified: Secondary | ICD-10-CM | POA: Diagnosis not present

## 2024-11-07 DIAGNOSIS — Z720 Tobacco use: Secondary | ICD-10-CM | POA: Diagnosis not present

## 2024-11-07 DIAGNOSIS — G8929 Other chronic pain: Secondary | ICD-10-CM | POA: Diagnosis not present

## 2024-11-07 NOTE — Progress Notes (Signed)
 " Cardiology Office Note   Date:  11/07/2024  ID:  Terry Tapia, DOB 24-Jul-1961, MRN [id number] PCP: Alla Amis, MD  [facility] Providers Cardiologist:  Lonni Hanson, MD Cardiology APP:  Gerard Frederick, NP     History of Present Illness Terry Tapia is a 63 y.o. male with past medical history of hypertension, asthma/COPD, celiac disease, GERD, intermittent chest pain, who is here today for follow-up.   He underwent exercise stress testing [facility] in 2016 for evaluation of atypical chest pain the study was nondiagnostic due to failure to achieve target heart rate. Echocardiogram at that time showed mildly reduced LVEF 50% and no other significant structural abnormalities. He was scheduled for repeat echocardiogram as well as a coronary CTA. Repeat echocardiogram revealed LVEF 55 to 60%, no RWMA, and no valvular abnormalities. Coronary CTA completed revealed a coronary calcium  score of 0 and was recommended to consider nonatherosclerotic causes of chest pain.   Seen in clinic 04/06/2023 for intermittent chest pain that been going for 1 to 2 months.  He also complained of claudication to his bilateral lower extremities and stated he had had a functional evaluation completed 2 he was advised he would no longer be able to return to any type of work that he been doing previously.  No medication changes were made and he was scheduled for ABIs for his complaint of intermittent claudication.  He had previously been seen in clinic 07/22/2023.  At that time was doing okay from a cardiac perspective.  He was scheduled for an upper endoscopy.  Continue to have chronic back pain and leg pain associated to his back pain.  Calcium  score of 0 with a normal echocardiogram.  He was to have repeat labs in 10 to 12 weeks at that time.  He was last seen in clinic 04/22/2024 stating he was doing well from a cardiac perspective.  Continued to have leg pain related to chronic back pain and sciatica.   Recently was started on rosuvastatin  5 mg 3 times a week.  There were no medication changes that were made and further testing that was ordered at that time.   He returns to clinic today accompanied by family member.  States that he has been doing well from a cardiac perspective.  Denies any chest pain, shortness of breath, dyspnea on exertion or palpitations.  Continues to have leg pain related to his chronic back pain.  States that he has been with elevated started his statin therapy.  States that he was recently evaluated by neurosurgery and was advised that he is not a candidate for back surgery as the risk is too high for him becoming paralyzed so we will be with conservative management at this point.  He denies any recent hospitalizations or visits to the emergency department.  ROS: 10 point review of systems has been reviewed and considered negative the exception was been listed in the HPI  Studies Reviewed EKG Interpretation Date/Time:  Monday November 07 2024 14:54:43 EST Ventricular Rate:  83 PR Interval:  154 QRS Duration:  86 QT Interval:  356 QTC Calculation: 418 R Axis:   -25  Text Interpretation: Normal sinus rhythm Normal ECG When compared with ECG of 22-Apr-2024 09:10, No significant change was found Confirmed by Gerard Frederick ([id number]) on 11/07/2024 2:57:11 PM    ABI's 06/02/23 Summary:  Right: Resting right ankle-brachial index is within normal range. The  right toe-brachial index is normal.   Left: Resting left ankle-brachial index is within  normal range. The left  toe-brachial index is normal.    TTE 04/10/23 1. Left ventricular ejection fraction, by estimation, is 55 to 60%. Left  ventricular ejection fraction by 3D volume is 55 %. The left ventricle has  normal function. The left ventricle has no regional wall motion  abnormalities. Left ventricular diastolic   parameters were normal. The average left ventricular global longitudinal  strain is -17.7 %.   2. Right  ventricular systolic function is normal. The right ventricular  size is normal. Tricuspid regurgitation signal is inadequate for assessing  PA pressure.   3. The mitral valve is normal in structure. No evidence of mitral valve  regurgitation. No evidence of mitral stenosis.   4. The aortic valve has an indeterminant number of cusps. Aortic valve  regurgitation is not visualized. No aortic stenosis is present.   5. The inferior vena cava is normal in size with greater than 50%  respiratory variability, suggesting right atrial pressure of 3 mmHg.    Coronary CTA 03/26/23 IMPRESSION: 1. Normal coronary calcium  score of 0. Patient is low risk.   2. Normal coronary origin with right dominance.   3. No evidence of CAD.   4. CAD-RADS 0. Consider non-atherosclerotic causes of chest pain.  Risk Assessment/Calculations           Physical Exam VS:  BP 130/76 (BP Location: Left Arm, Patient Position: Sitting, Cuff Size: Normal)   Pulse 83   Ht 5' 5 (1.651 m)   Wt 135 lb 6.4 oz (61.4 kg)   SpO2 96%   BMI 22.53 kg/m        Wt Readings from Last 3 Encounters:  11/07/24 135 lb 6.4 oz (61.4 kg)  04/22/24 139 lb 8 oz (63.3 kg)  07/22/23 136 lb 6.4 oz (61.9 kg)    GEN: Well nourished, well developed in no acute distress NECK: No JVD; No carotid bruits CARDIAC: RRR, no murmurs, rubs, gallops RESPIRATORY:  Clear to auscultation without rales, wheezing or rhonchi  ABDOMEN: Soft, non-tender, non-distended EXTREMITIES:  No edema; No deformity   ASSESSMENT AND PLAN Mixed hyperlipidemia with last LDL of 122.  She is taking supplements of red yeast rice.  He states that he has updated labs with his PCP.  Recommended LDL goal of 100 or less.  Ongoing management per PCP.  Primary hypertension with a blood pressure today 130/76.  Blood pressure has been well-controlled.  He is continued without any hypertensive medications.  EKG done today reveals sinus rhythm with rate 83 with no acute ischemic  changes.  Intermittent claudication with ABIs previously completed and was slightly abnormal.  He continues to have palpable pulses to his bilateral lower extremities with no rest pain or ulcerations.  Encouraged to walk and exercise is much as possible even though he is limited due to his chronic back pain.  Chronic back pain previously evaluated by surgery not deemed a surgical candidate due to risk versus benefit.  Conservative therapy was recommended.  Tobacco use with total cessation continued to be recommended.       Dispo: Patient to return to clinic to see MD/APP in 6 months or sooner if needed for further evaluation.  Signed, Carlena Ruybal, NP   "

## 2024-11-07 NOTE — Patient Instructions (Signed)
 Medication Instructions:  Your physician recommends that you continue on your current medications as directed. Please refer to the Current Medication list given to you today.   *If you need a refill on your cardiac medications before your next appointment, please call your pharmacy*  Lab Work: No labs ordered today  If you have labs (blood work) drawn today and your tests are completely normal, you will receive your results only by: MyChart Message (if you have MyChart) OR A paper copy in the mail If you have any lab test that is abnormal or we need to change your treatment, we will call you to review the results.  Testing/Procedures: No test ordered today   Follow-Up: At [facility], you and your health needs are our priority.  As part of our continuing mission to provide you with exceptional heart care, our providers are all part of one team.  This team includes your primary Cardiologist (physician) and Advanced Practice Providers or APPs (Physician Assistants and Nurse Practitioners) who all work together to provide you with the care you need, when you need it.  Your next appointment:   6 month(s)  Provider:   You may see Lonni Hanson, MD or one of the following Advanced Practice Providers on your designated Care Team:   Tylene Lunch, NP

## 2025-05-08 ENCOUNTER — Ambulatory Visit: Admitting: Cardiology
